# Patient Record
Sex: Male | Born: 1955 | Race: White | Hispanic: No | Marital: Married | Smoking: Former smoker
Health system: Southern US, Community
[De-identification: ages and names within clinical notes are randomized; demographics above are authoritative.]

## PROBLEM LIST (undated history)

## (undated) DIAGNOSIS — J939 Pneumothorax, unspecified: Secondary | ICD-10-CM

## (undated) DIAGNOSIS — I10 Essential (primary) hypertension: Secondary | ICD-10-CM

## (undated) DIAGNOSIS — N189 Chronic kidney disease, unspecified: Secondary | ICD-10-CM

## (undated) DIAGNOSIS — E785 Hyperlipidemia, unspecified: Secondary | ICD-10-CM

## (undated) DIAGNOSIS — K219 Gastro-esophageal reflux disease without esophagitis: Secondary | ICD-10-CM

## (undated) HISTORY — DX: Gastro-esophageal reflux disease without esophagitis: K21.9

## (undated) HISTORY — PX: COSMETIC SURGERY: SHX468

## (undated) HISTORY — DX: Pneumothorax, unspecified: J93.9

## (undated) HISTORY — PX: SPINE SURGERY: SHX786

## (undated) HISTORY — PX: NOSE SURGERY: SHX723

## (undated) HISTORY — PX: BACK SURGERY: SHX140

## (undated) HISTORY — DX: Hyperlipidemia, unspecified: E78.5

---

## 2006-11-05 ENCOUNTER — Observation Stay (HOSPITAL_COMMUNITY): Admission: EM | Admit: 2006-11-05 | Discharge: 2006-11-05 | Payer: Self-pay | Admitting: Emergency Medicine

## 2008-01-04 IMAGING — CT CT HEAD W/O CM
1 of 2 series · 13 of 30 positions shown, 17 images · IV contrast (agent unspecified)
Comparison: None.

CLINICAL DATA: Tingling in right hand and leg.  
 HEAD CT WITHOUT CONTRAST:
TECHNIQUE: Contiguous axial images were obtained from the base of the skull through the vertex according to standard protocol without contrast.

[Series 2: head routine 4.8 h47s · axial · 0.45mm/px · z∈[-73,+43]mm · 13 of 30 slices shown, 17 images]
[im 3/30  brain]
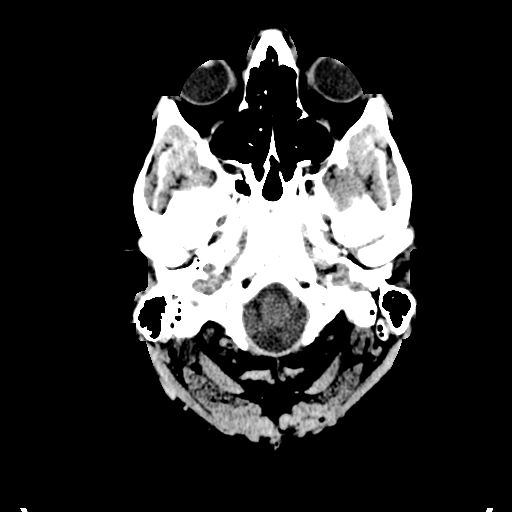
[im 3/30  bone]
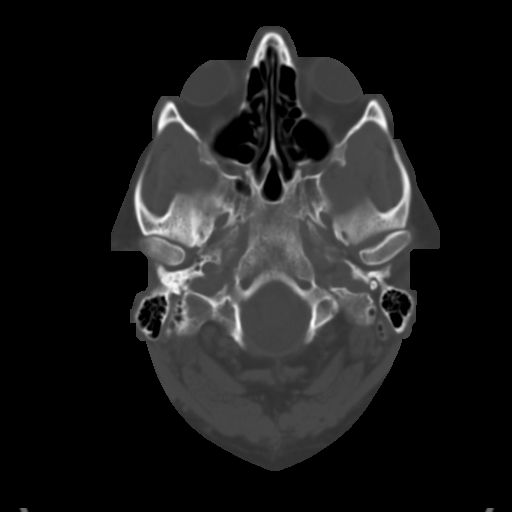
[im 5/30  brain]
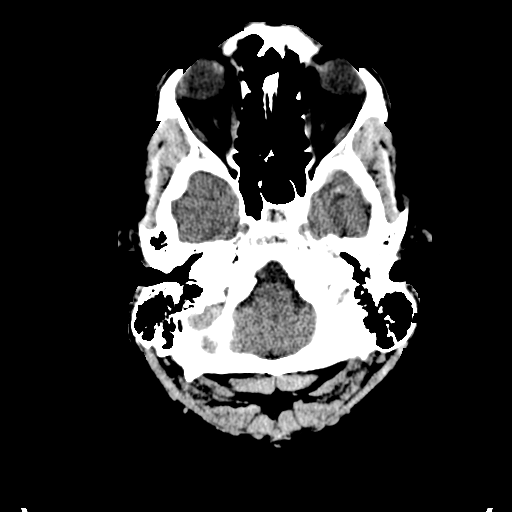
[im 7/30  brain]
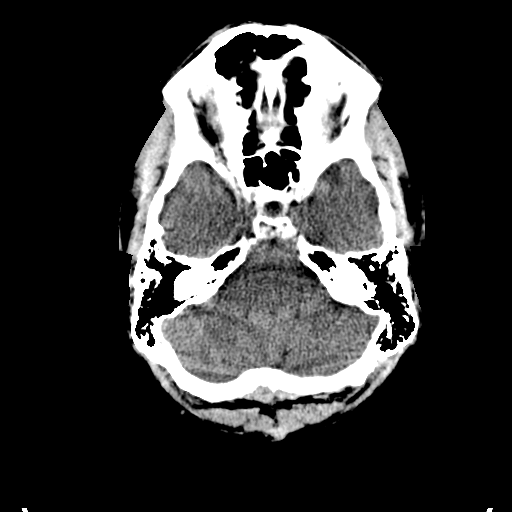
[im 9/30  brain]
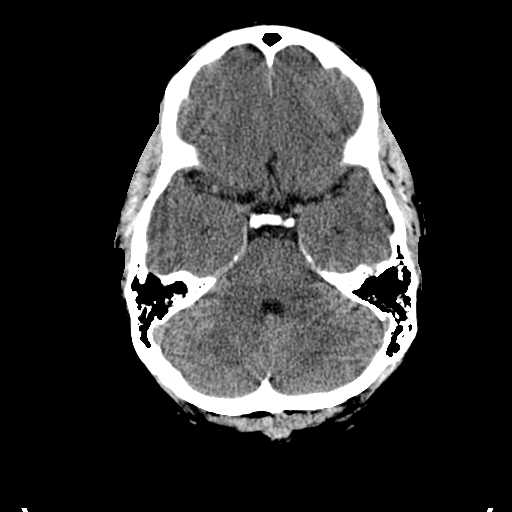
[im 11/30  brain]
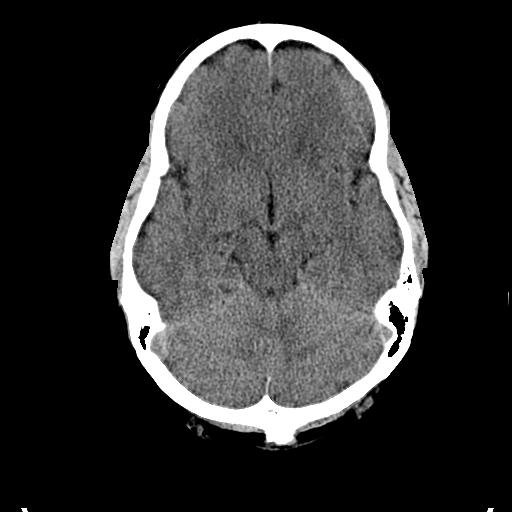
[im 11/30  bone]
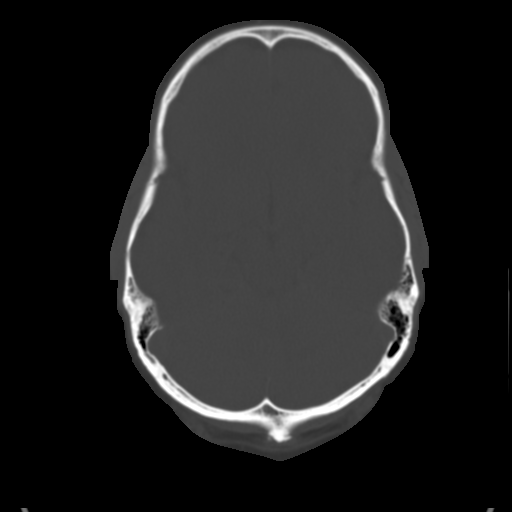
[im 13/30  brain]
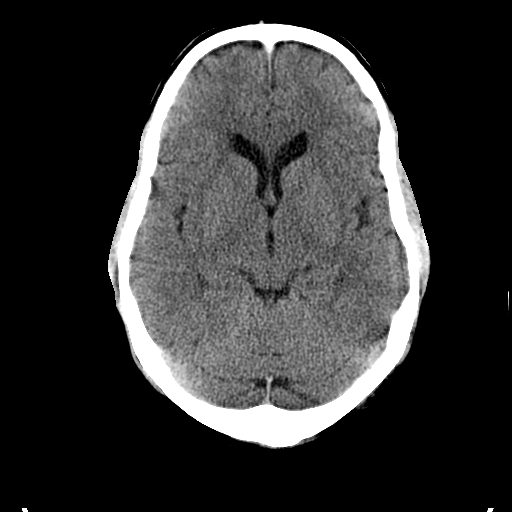
[im 15/30  brain]
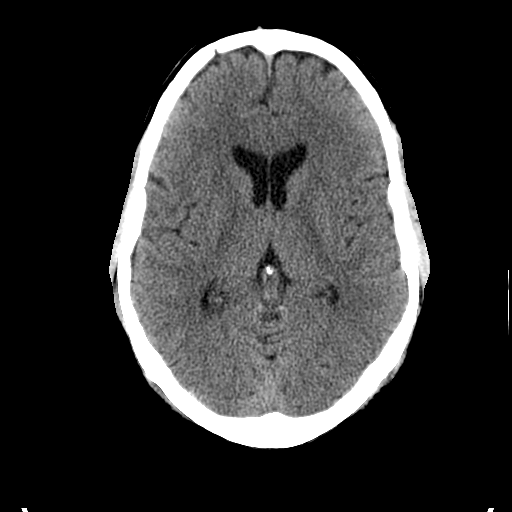
[im 17/30  brain]
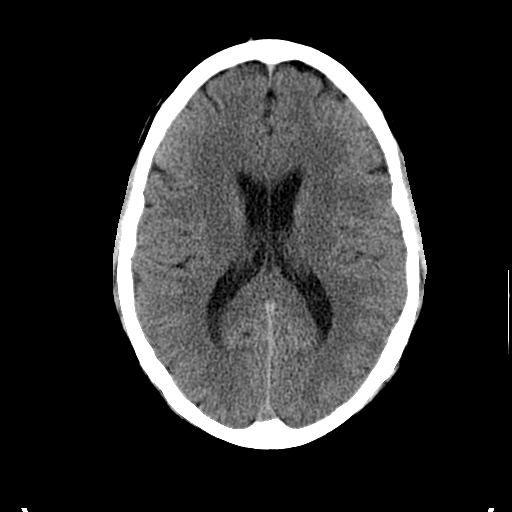
[im 19/30  brain]
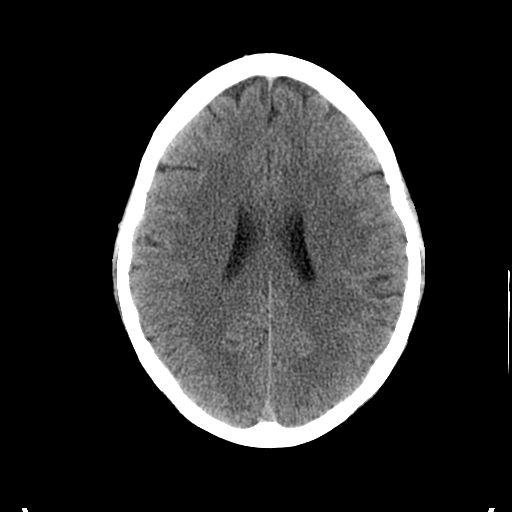
[im 19/30  bone]
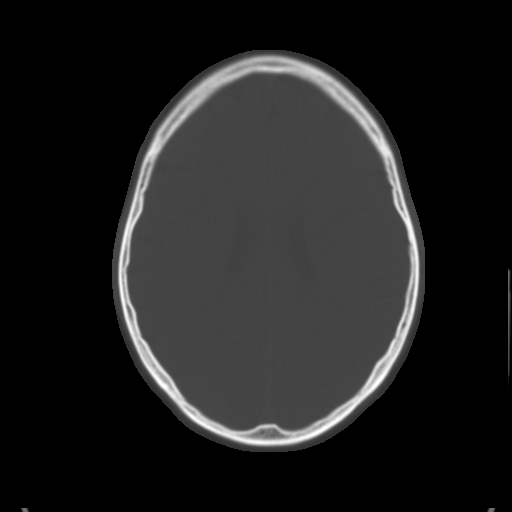
[im 21/30  brain]
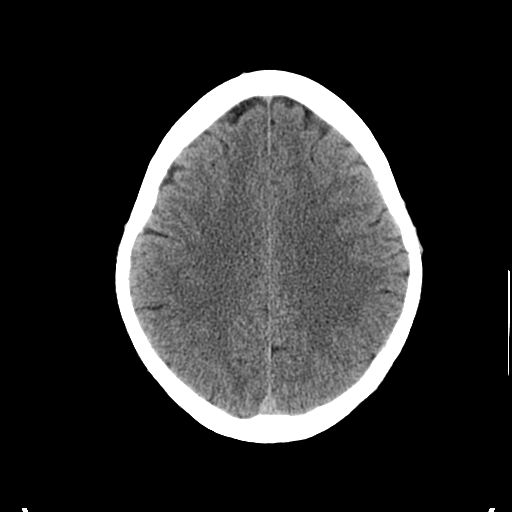
[im 23/30  brain]
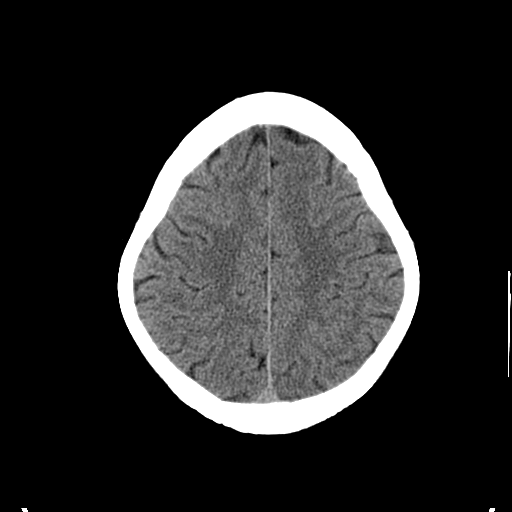
[im 25/30  brain]
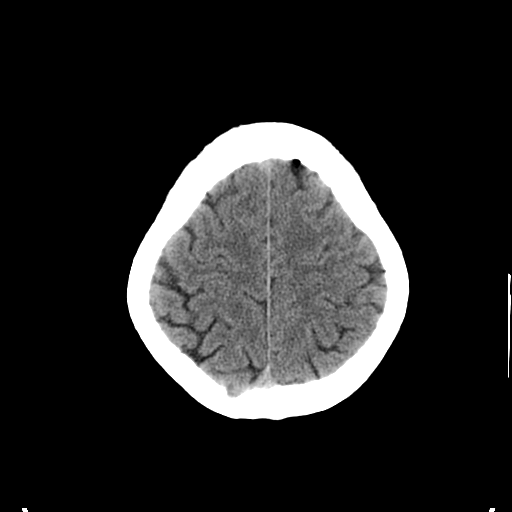
[im 27/30  brain]
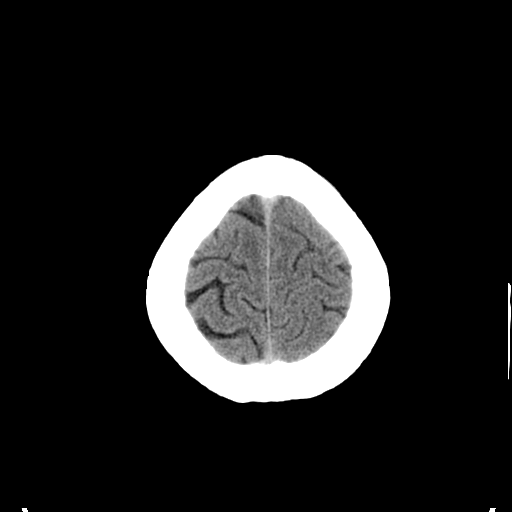
[im 27/30  bone]
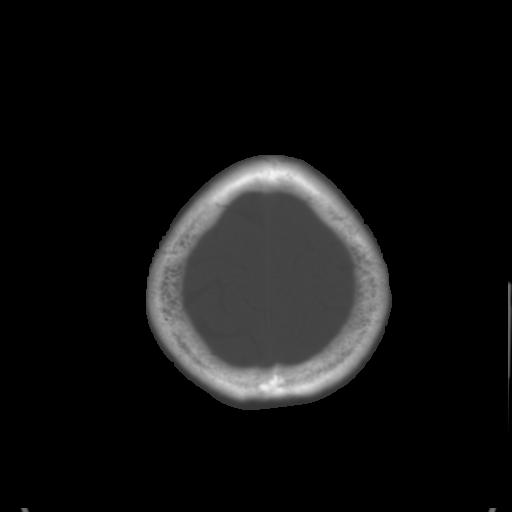

[13 of 30 positions shown; findings below may reference images not displayed]

FINDINGS: The brain appears normal without evidence of hemorrhage, infarct, mass, mass effect, midline shift or abnormal extraaxial fluid collection. No hydrocephalus.  Imaged paranasal sinuses and mastoid air cells are clear.
IMPRESSION: Negative exam.

## 2011-12-12 ENCOUNTER — Ambulatory Visit
Admission: RE | Admit: 2011-12-12 | Discharge: 2011-12-12 | Disposition: A | Discharge status: Home / Self Care | Payer: 59 | Source: Ambulatory Visit | Attending: Geriatric Medicine | Admitting: Geriatric Medicine

## 2011-12-12 ENCOUNTER — Other Ambulatory Visit: Payer: Self-pay | Admitting: Geriatric Medicine

## 2011-12-12 DIAGNOSIS — R109 Unspecified abdominal pain: Secondary | ICD-10-CM

## 2011-12-13 ENCOUNTER — Encounter (HOSPITAL_COMMUNITY): Payer: Self-pay | Admitting: Anesthesiology

## 2011-12-13 ENCOUNTER — Encounter (HOSPITAL_COMMUNITY): Payer: Self-pay | Admitting: *Deleted

## 2011-12-13 ENCOUNTER — Other Ambulatory Visit: Payer: Self-pay | Admitting: Urology

## 2011-12-13 ENCOUNTER — Encounter: Payer: Self-pay | Admitting: Urology

## 2011-12-13 ENCOUNTER — Observation Stay (HOSPITAL_COMMUNITY)
Admission: AD | Admit: 2011-12-13 | Discharge: 2011-12-14 | Disposition: A | Discharge status: Home / Self Care | Payer: 59 | Source: Ambulatory Visit | Attending: Urology | Admitting: Urology

## 2011-12-13 ENCOUNTER — Inpatient Hospital Stay (HOSPITAL_COMMUNITY): Payer: 59 | Admitting: Anesthesiology

## 2011-12-13 ENCOUNTER — Encounter (HOSPITAL_COMMUNITY)
Admission: AD | Disposition: A | Discharge status: Home / Self Care | Payer: Self-pay | Source: Ambulatory Visit | Attending: Urology

## 2011-12-13 DIAGNOSIS — R109 Unspecified abdominal pain: Secondary | ICD-10-CM | POA: Insufficient documentation

## 2011-12-13 DIAGNOSIS — Z01818 Encounter for other preprocedural examination: Secondary | ICD-10-CM | POA: Insufficient documentation

## 2011-12-13 DIAGNOSIS — I10 Essential (primary) hypertension: Secondary | ICD-10-CM | POA: Insufficient documentation

## 2011-12-13 DIAGNOSIS — N201 Calculus of ureter: Principal | ICD-10-CM | POA: Insufficient documentation

## 2011-12-13 DIAGNOSIS — E669 Obesity, unspecified: Secondary | ICD-10-CM | POA: Insufficient documentation

## 2011-12-13 HISTORY — DX: Chronic kidney disease, unspecified: N18.9

## 2011-12-13 HISTORY — PX: CYSTOSCOPY W/ URETERAL STENT PLACEMENT: SHX1429

## 2011-12-13 HISTORY — DX: Essential (primary) hypertension: I10

## 2011-12-13 LAB — CBC
Hemoglobin: 13.8 g/dL (ref 13.0–17.0)
MCV: 86.8 fL (ref 78.0–100.0)
Platelets: 199 10*3/uL (ref 150–400)
RBC: 4.48 MIL/uL (ref 4.22–5.81)
RDW: 12.4 % (ref 11.5–15.5)
WBC: 12.1 10*3/uL — ABNORMAL HIGH (ref 4.0–10.5)

## 2011-12-13 LAB — COMPREHENSIVE METABOLIC PANEL
ALT: 19 U/L (ref 0–53)
AST: 15 U/L (ref 0–37)
BUN: 26 mg/dL — ABNORMAL HIGH (ref 6–23)
CO2: 25 mEq/L (ref 19–32)
GFR calc Af Amer: 46 mL/min — ABNORMAL LOW (ref >90)
GFR calc non Af Amer: 40 mL/min — ABNORMAL LOW (ref >90)
Sodium: 134 mEq/L — ABNORMAL LOW (ref 135–145)

## 2011-12-13 LAB — SURGICAL PCR SCREEN
MRSA, PCR: NEGATIVE
Staphylococcus aureus: POSITIVE — AB

## 2011-12-13 SURGERY — CYSTOSCOPY, WITH RETROGRADE PYELOGRAM AND URETERAL STENT INSERTION
Anesthesia: General | Site: Ureter | Laterality: Left | Wound class: Clean Contaminated

## 2011-12-13 MED ORDER — LIDOCAINE HCL 2 % EX GEL
CUTANEOUS | Status: AC
  Filled 2011-12-13: qty 10

## 2011-12-13 MED ORDER — FENTANYL CITRATE 0.05 MG/ML IJ SOLN
INTRAMUSCULAR | Status: DC | PRN
  Administered 2011-12-13: 50 ug via INTRAVENOUS
  Administered 2011-12-13: 25 ug via INTRAVENOUS
  Administered 2011-12-13: 50 ug via INTRAVENOUS
  Administered 2011-12-13: 25 ug via INTRAVENOUS

## 2011-12-13 MED ORDER — FENTANYL CITRATE 0.05 MG/ML IJ SOLN: 25.0000 ug | INTRAMUSCULAR | Status: DC | PRN

## 2011-12-13 MED ORDER — ACETAMINOPHEN 10 MG/ML IV SOLN
1000.0000 mg | INTRAVENOUS | Status: AC
  Administered 2011-12-13: 1000 mg via INTRAVENOUS

## 2011-12-13 MED ORDER — PROPOFOL 10 MG/ML IV EMUL
INTRAVENOUS | Status: DC | PRN
  Administered 2011-12-13 (×2): 200 mL via INTRAVENOUS

## 2011-12-13 MED ORDER — LACTATED RINGERS IV SOLN: INTRAVENOUS | Status: DC

## 2011-12-13 MED ORDER — STERILE WATER FOR IRRIGATION IR SOLN
Status: DC | PRN
  Administered 2011-12-13: 3000 mL

## 2011-12-13 MED ORDER — IOHEXOL 300 MG/ML  SOLN
INTRAMUSCULAR | Status: AC
  Filled 2011-12-13: qty 1

## 2011-12-13 MED ORDER — CEFAZOLIN SODIUM 1-5 GM-% IV SOLN
INTRAVENOUS | Status: AC
  Filled 2011-12-13: qty 50

## 2011-12-13 MED ORDER — CEFAZOLIN SODIUM 1-5 GM-% IV SOLN
1.0000 g | INTRAVENOUS | Status: AC
  Administered 2011-12-13: 1 g via INTRAVENOUS

## 2011-12-13 MED ORDER — KETOROLAC TROMETHAMINE 30 MG/ML IJ SOLN
INTRAMUSCULAR | Status: DC | PRN
  Administered 2011-12-13: 30 mg via INTRAVENOUS

## 2011-12-13 MED ORDER — LACTATED RINGERS IV SOLN
INTRAVENOUS | Status: DC | PRN
  Administered 2011-12-13: 22:00:00 via INTRAVENOUS

## 2011-12-13 MED ORDER — ACETAMINOPHEN 10 MG/ML IV SOLN
INTRAVENOUS | Status: AC
  Filled 2011-12-13: qty 100

## 2011-12-13 MED ORDER — PROMETHAZINE HCL 25 MG/ML IJ SOLN: 6.2500 mg | INTRAMUSCULAR | Status: DC | PRN

## 2011-12-13 MED ORDER — FENTANYL CITRATE 0.05 MG/ML IJ SOLN
INTRAMUSCULAR | Status: AC
  Filled 2011-12-13: qty 2

## 2011-12-13 MED ORDER — BELLADONNA ALKALOIDS-OPIUM 16.2-60 MG RE SUPP
RECTAL | Status: AC
  Filled 2011-12-13: qty 1

## 2011-12-13 MED ORDER — BELLADONNA ALKALOIDS-OPIUM 16.2-60 MG RE SUPP
RECTAL | Status: DC | PRN
  Administered 2011-12-13: 1 via RECTAL

## 2011-12-13 MED ORDER — IOHEXOL 300 MG/ML  SOLN
INTRAMUSCULAR | Status: DC | PRN
  Administered 2011-12-13: 5 mL

## 2011-12-13 MED ORDER — MIDAZOLAM HCL 5 MG/5ML IJ SOLN
INTRAMUSCULAR | Status: DC | PRN
  Administered 2011-12-13: 2 mg via INTRAVENOUS

## 2011-12-13 SURGICAL SUPPLY — 19 items
ADAPTER CATH URET PLST 4-6FR (CATHETERS) ×2 IMPLANT
ADPR CATH URET STRL DISP 4-6FR (CATHETERS) ×1
BAG URO CATCHER STRL LF (DRAPE) ×2 IMPLANT
CATH INTERMIT  6FR 70CM (CATHETERS) ×2 IMPLANT
CLOTH BEACON ORANGE TIMEOUT ST (SAFETY) ×2 IMPLANT
DRAPE CAMERA CLOSED 9X96 (DRAPES) ×2 IMPLANT
GLOVE BIOGEL M STRL SZ7.5 (GLOVE) ×2 IMPLANT
GLOVE SURG SS PI 8.5 STRL IVOR (GLOVE) ×1
GLOVE SURG SS PI 8.5 STRL STRW (GLOVE) ×1 IMPLANT
GOWN STRL NON-REIN LRG LVL3 (GOWN DISPOSABLE) ×2 IMPLANT
GOWN STRL REIN XL XLG (GOWN DISPOSABLE) ×2 IMPLANT
GUIDEWIRE STR DUAL SENSOR (WIRE) ×2 IMPLANT
MANIFOLD NEPTUNE II (INSTRUMENTS) ×2 IMPLANT
NS IRRIG 1000ML POUR BTL (IV SOLUTION) ×2 IMPLANT
PACK CYSTO (CUSTOM PROCEDURE TRAY) ×2 IMPLANT
SCRUB PCMX 4 OZ (MISCELLANEOUS) ×2 IMPLANT
STENT CONTOUR URETERAL (STENTS) ×2 IMPLANT
TUBING CONNECTING 10 (TUBING) ×2 IMPLANT
WATER STERILE IRR 3000ML UROMA (IV SOLUTION) ×2 IMPLANT

## 2011-12-13 NOTE — Anesthesia Preprocedure Evaluation (Signed)
Anesthesia Evaluation    Airway Mallampati: II TM Distance: >3 FB Neck ROM: Full    Dental No notable dental hx.    Pulmonary  clear to auscultation  Pulmonary exam normal       Cardiovascular hypertension, Pt. on medications Regular Normal    Neuro/Psych    GI/Hepatic   Endo/Other    Renal/GU      Musculoskeletal   Abdominal (+) obese,   Peds  Hematology   Anesthesia Other Findings   Reproductive/Obstetrics                           Anesthesia Physical Anesthesia Plan  ASA: II and Emergent  Anesthesia Plan: General   Post-op Pain Management:    Induction: Intravenous  Airway Management Planned: LMA  Additional Equipment:   Intra-op Plan:   Post-operative Plan: Extubation in OR  Informed Consent: I have reviewed the patients History and Physical, chart, labs and discussed the procedure including the risks, benefits and alternatives for the proposed anesthesia with the patient or authorized representative who has indicated his/her understanding and acceptance.   Dental advisory given  Plan Discussed with: CRNA  Anesthesia Plan Comments:         Anesthesia Quick Evaluation

## 2011-12-13 NOTE — H&P (Signed)
Chief Complaint  cc: Dr. Pete Glatter   Reason For Visit  Kidney stone   Active Problems Problems  1. Proximal Ureteral Stone On The Left 592.1  History of Present Illness    56 yo married male presents today as a [place] referred by Dr. Pete Glatter for a 7mm Lt proximal ureteral stone with mild hydronephrosis.  He has been having Lt flank pain x 6 days.  He has had no previous hx of kidney stones.  His daughter has had kidney stones. + constipation, nausea, and is recovering from flu.   Received a call from Dr. Laverle Hobby nurse with a verbal lab result for his creatinine of 1.64.  10/05/10  PSA - 1.04   Past Medical History Problems  1. History of  Cervical Disc Degeneration 722.4 2. History of  Hypertension 401.9  Surgical History Problems  1. History of  Neck Surgery 2. History of  Spine Repair  Current Meds 1. Hydrocodone-Acetaminophen 5-500 MG Oral Tablet; Therapy: 14Jan2013 to 2. Lisinopril-Hydrochlorothiazide 10-12.5 MG Oral Tablet; Therapy: 20Aug2012 to 3. Motrin TABS; Therapy: (Recorded:15Jan2013) to 4. Vitamin B12 TABS; Therapy: (Recorded:15Jan2013) to  Allergies Medication  1. No Known Drug Allergies  Family History Problems  1. Family history of  Family Health Status Children ___ Living Daughters 2 2. Family history of  Family Health Status Children ___ Living Sons 1 3. Family history of  Family Health Status Of Father - Alive 4. Family history of  Family Health Status Of Mother - Alive 5. Paternal history of  Heart Disease V17.49 6. Daughter's history of  Nephrolithiasis 7. Fraternal history of  Thyroid Disorder V18.19  Social History Problems    Caffeine Use 4   Former Smoker V15.82 smoked 1/2 ppd for 10 years; quit approx. 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  Recent flu, but now resolving.    Vitals Vital Signs [Data Includes: Last 1 Day]  15Jan2013 12:09PM  BMI Calculated: 33.18 BSA Calculated: 2.26 Height: 5 ft 11 in Weight: 237 lb  Blood Pressure: 160 / 103 Temperature: 98 F Heart Rate: 93  Physical Exam Constitutional: Well nourished and well developed . No acute distress.  ENT:. The ears and nose are normal in appearance.  Neck: The appearance of the neck is normal and no neck mass is present.  Pulmonary: No respiratory distress and normal respiratory rhythm and effort.  Cardiovascular: Heart rate and rhythm are normal . No peripheral edema.  Abdomen: The abdomen is soft and nontender. No masses are palpated. No CVA tenderness. No hernias are palpable. No hepatosplenomegaly noted.  Rectal: Rectal exam demonstrates normal sphincter tone, no tenderness and no masses. The prostate has no nodularity and is not tender. The left seminal vesicle is nonpalpable. The right seminal vesicle is nonpalpable. The perineum is normal on inspection.  Genitourinary: Examination of the penis demonstrates no discharge, no masses, no lesions and a normal meatus. The scrotum is without lesions. The right epididymis is palpably normal and non-tender. The left epididymis is palpably normal and non-tender. The right testis is non-tender and without masses. The left testis is non-tender and without masses.  Lymphatics: The femoral and inguinal nodes are not enlarged or tender.  Skin: Normal skin turgor, no visible rash  and no visible skin lesions.  Neuro/Psych:. Mood and affect are appropriate.    Results/Data Urine [Data Includes: Last 1 Day]   15Jan2013  COLOR YELLOW   APPEARANCE CLEAR   SPECIFIC GRAVITY 1.025   pH 5.0   GLUCOSE NEG mg/dL  BILIRUBIN NEG   KETONE NEG mg/dL  BLOOD LARGE    PROTEIN NEG mg/dL  UROBILINOGEN 0.2 mg/dL  NITRITE NEG   LEUKOCYTE ESTERASE NEG   SQUAMOUS EPITHELIAL/HPF RARE   WBC 0-3 WBC/hpf  RBC 0-3 RBC/hpf  BACTERIA RARE   CRYSTALS NONE SEEN   CASTS NONE SEEN   Other SLT MUCUS    Assessment Assessed  1. Proximal Ureteral Stone On The Left 592.1      CT shows  L upper ureteral stone, mild hydro, in pt with dehydration, and elevated psa. he is having some L flank pain, on 3 motrin q 3-5 hrs. Note acid urine (pH 5.0), and cannot see stone on KUB from CT.   Plan  1. Toradol, tylenol, and IV hydration, and stent placement tonight.  2. Cr/gfr, and KUB today pre-op.   Signatures Electronically signed by : [name], M.D.; Dec 13 2011  2:17PM

## 2011-12-13 NOTE — Progress Notes (Signed)
Patient states he will be having lithotripsy next week. Blue folder from [place] stone given to family. Brief instructions to hold aspirin and NSAIDS given to family. They verbalize understanding.

## 2011-12-13 NOTE — Progress Notes (Signed)
Patient and wife informed that surgery will be delayed until around 9 PM. They verbalize understanding.

## 2011-12-13 NOTE — Transfer of Care (Addendum)
Immediate Anesthesia Transfer of Care Note  Patient: Travis Burgess  Procedure(s) Performed:  CYSTOSCOPY WITH RETROGRADE PYELOGRAM/URETERAL STENT PLACEMENT  Patient Location: PACU  Anesthesia Type: General  Level of Consciousness: sedated, patient cooperative and responds to stimulaton  Airway & Oxygen Therapy: Patient Spontanous Breathing and Patient connected to face mask oxgen  Post-op Assessment: Report given to PACU RN and Post -op Vital signs reviewed and stable  Post vital signs: Reviewed and stable  Complications: No apparent anesthesia complications  Post vital signs: stable Filed Vitals:   12/13/11 1701  BP: 163/91  Pulse: 92  Temp: 37.1 C  Resp: 18     Temporary cap in place on release to PACU staff.

## 2011-12-13 NOTE — Anesthesia Postprocedure Evaluation (Signed)
  Anesthesia Post-op Note  Patient: Travis Burgess  Procedure(s) Performed:  CYSTOSCOPY WITH RETROGRADE PYELOGRAM/URETERAL STENT PLACEMENT  Patient Location: PACU  Anesthesia Type: General  Level of Consciousness: awake and alert   Airway and Oxygen Therapy: Patient Spontanous Breathing  Post-op Pain: mild  Post-op Assessment: Post-op Vital signs reviewed, Patient's Cardiovascular Status Stable, Respiratory Function Stable, Patent Airway and No signs of Nausea or vomiting  Post-op Vital Signs: stable  Complications: No apparent anesthesia complications

## 2011-12-13 NOTE — Op Note (Signed)
Pre-operative diagnosis : Left upper ureteral stone  Postoperative diagnosis: Same  Operation: Cystoscopy ureteroscopy, left retrograde PolyGram interpretation, left double-J stent (6 [place] by 26 cm)  Surgeon:  Kathie Rhodes. Patsi Sears, MD  Anesthesia:   General LMA  Preparation: After appropriate preanesthesia, the patient was brought to the operating room, and placed on the operating table in the dorsal supine position where general LMA anesthesia was introduced. Timeout was observed.  Review history: 56 year old male with left flank pain, nausea, and microscopic hematuria, with CT showing a 7 mm left UPJ stone, or possibly in the upper ureter. The stone is poorly visualized on KUB the disease was seen on CT. He is now for cystoscopy, left retropyelogram, and left double-J stent.  Statement of  Likelihood of Success: Excellent. TIME-OUT observed.:  Procedure: Cystourethroscopy was accomplished, showing normal penile meatus, normal distal urethra, normal proximal urethra. The prostate is 2+ and benign. The bladder neck is normal. There is clear reflux in both cortices. The bladder itself showed no trabeculation, no stones tumors or diverticula  are noted. Left retropyelogram was then performed, which shows a stone in the left upper ureter at the level of the ureteral pelvic junction. A guidewire is passed and coiled into the renal pelvis, which appears to be an intrarenal pelvis. Over the wire, a 6 [place] by 26 cm double-J stent is passed without suture area fluoroscopic control shows is in good position. The patient received a B. and O. suppository at the beginning of the procedure, and 31 g of IV Toradol at the end of the procedure. He was awakened and taken to recovery room in good condition.

## 2011-12-14 ENCOUNTER — Observation Stay (HOSPITAL_COMMUNITY): Payer: 59

## 2011-12-14 LAB — BASIC METABOLIC PANEL
GFR calc Af Amer: 47 mL/min — ABNORMAL LOW (ref >90)
GFR calc non Af Amer: 40 mL/min — ABNORMAL LOW (ref >90)
Potassium: 3.9 mEq/L (ref 3.5–5.1)
Sodium: 136 mEq/L (ref 135–145)

## 2011-12-14 MED ORDER — POLYETHYLENE GLYCOL 3350 17 G PO PACK
17.0000 g | PACK | Freq: Every day | ORAL | Status: DC | PRN
  Filled 2011-12-14: qty 1

## 2011-12-14 MED ORDER — OXYBUTYNIN CHLORIDE 5 MG PO TABS
5.0000 mg | ORAL_TABLET | Freq: Three times a day (TID) | ORAL | Status: DC | PRN
  Filled 2011-12-14: qty 1

## 2011-12-14 MED ORDER — INFLUENZA VIRUS VACC SPLIT PF IM SUSP
0.5000 mL | Freq: Once | INTRAMUSCULAR | Status: AC
  Administered 2011-12-14: 0.5 mL via INTRAMUSCULAR
  Filled 2011-12-14: qty 0.5

## 2011-12-14 MED ORDER — DEXTROSE-NACL 5-0.45 % IV SOLN
INTRAVENOUS | Status: DC
  Administered 2011-12-14 (×2): via INTRAVENOUS

## 2011-12-14 MED ORDER — HYDROCODONE-ACETAMINOPHEN 10-325 MG PO TABS: 1.0000 | ORAL_TABLET | Freq: Four times a day (QID) | ORAL | Status: DC | PRN

## 2011-12-14 MED ORDER — HYDROCHLOROTHIAZIDE 12.5 MG PO CAPS
12.5000 mg | ORAL_CAPSULE | Freq: Every day | ORAL | Status: DC
  Administered 2011-12-14: 12.5 mg via ORAL
  Filled 2011-12-14: qty 1

## 2011-12-14 MED ORDER — CIPROFLOXACIN HCL 500 MG PO TABS: 500.0000 mg | ORAL_TABLET | Freq: Two times a day (BID) | ORAL | Status: AC

## 2011-12-14 MED ORDER — LISINOPRIL 10 MG PO TABS
10.0000 mg | ORAL_TABLET | Freq: Every day | ORAL | Status: DC
  Administered 2011-12-14: 10 mg via ORAL
  Filled 2011-12-14: qty 1

## 2011-12-14 MED ORDER — ONDANSETRON HCL 4 MG/2ML IJ SOLN: 4.0000 mg | INTRAMUSCULAR | Status: DC | PRN

## 2011-12-14 MED ORDER — OXYBUTYNIN CHLORIDE 5 MG PO TABS: 5.0000 mg | ORAL_TABLET | Freq: Three times a day (TID) | ORAL | Status: AC | PRN

## 2011-12-14 MED ORDER — DIPHENHYDRAMINE HCL 50 MG/ML IJ SOLN: 12.5000 mg | Freq: Four times a day (QID) | INTRAMUSCULAR | Status: DC | PRN

## 2011-12-14 MED ORDER — ZOLPIDEM TARTRATE 5 MG PO TABS
5.0000 mg | ORAL_TABLET | Freq: Every evening | ORAL | Status: DC | PRN
  Administered 2011-12-14: 5 mg via ORAL
  Filled 2011-12-14: qty 1

## 2011-12-14 MED ORDER — HYDROMORPHONE HCL PF 1 MG/ML IJ SOLN: 0.5000 mg | INTRAMUSCULAR | Status: DC | PRN

## 2011-12-14 MED ORDER — LISINOPRIL-HYDROCHLOROTHIAZIDE 10-12.5 MG PO TABS: 1.0000 | ORAL_TABLET | Freq: Every day | ORAL | Status: DC

## 2011-12-14 MED ORDER — DIPHENHYDRAMINE HCL 12.5 MG/5ML PO ELIX: 12.5000 mg | ORAL_SOLUTION | Freq: Four times a day (QID) | ORAL | Status: DC | PRN

## 2011-12-14 MED ORDER — NITROFURANTOIN MONOHYD MACRO 100 MG PO CAPS
100.0000 mg | ORAL_CAPSULE | Freq: Two times a day (BID) | ORAL | Status: DC
  Administered 2011-12-14 (×2): 100 mg via ORAL
  Filled 2011-12-14 (×3): qty 1

## 2011-12-14 MED ORDER — HYDROCODONE-ACETAMINOPHEN 5-325 MG PO TABS: 1.0000 | ORAL_TABLET | ORAL | Status: DC | PRN

## 2011-12-14 NOTE — Discharge Instructions (Signed)
Ureteral Stent A ureteral stent is a soft plastic tube with multiple holes. The stent is inserted into a ureter to help drain urine from the kidney into the bladder. The tube has a coil on each end to keep it from falling out. One end stays in the kidney. The other end stays in the bladder. A stent cannot be seen from the outside. Usually it does not keep you from going about normal routines. A ureteral stent is used to bypass a blockage in your kidney or ureter. This blockage can be caused by kidney stones, scar tissue, pregnancy, or other causes. It can also be used during treatment to remove a kidney stone or to let a ureter heal after surgery. The stent allows urine to drain from the kidney into the bladder. It is most often taken out after the blockage has been removed or the ureter has healed. If a stent is needed for a long time, it will be changed every few months. INSERTING THE STENT Your stent is put in by a urologist. This is a medical doctor trained for treating genitourinary (kidney, ureter and bladder) problems. Before your stent is put in, your caregiver may order x-rays or other imaging tests of your kidneys and ureters. The stent is inserted in a hospital or same day surgical center. You can anticipate going home the same day. PROCEDURE  A special x-ray machine called a fluoroscope is used to guide the insertion of your stent. This allows your doctor to make sure the stent is in the correct place.   First you are given anesthesia to keep you comfortable.   Then your doctor inserts a special lighted instrument called a cystoscope into your bladder. This allows your doctor to see the opening to the ureter.   A thin wire is carefully threaded into the bladder and up the ureter. The stent is inserted over the wire and the wire is then removed.  HOME CARE INSTRUCTIONS   While the stent is in place, you may feel some discomfort. Certain movements may trigger pain or a feeling that you need  to urinate. Your caregiver may give you pain medication. Only take over-the-counter or prescription medicines for pain, discomfort, or fever as directed by your caregiver. Do not take aspirin as this can make bleeding worse.   You may be given medications to prevent infection or bladder spasms. Be sure to take all medications as directed.   Drink plenty of fluids.   You may have small amounts of bleeding causing your urine to be slightly red. This is nothing to be concerned about.  REMOVAL OF THE STENT Your stent is left in until the blockage is resolved. This may take two weeks or longer. Before the stent is removed, you may have an x-ray make sure the ureter is open. The stent can be removed by your caregiver in the office. Medications may be given for comfort. Be sure to keep all follow-up appointments so your caregiver can check that you are healing properly. SEEK IMMEDIATE MEDICAL CARE IF:   Your urine is dark red or has blood clots.   You are incontinent (leaking urine).   You have an oral temperature above 102 F (38.9 C), chills, nausea (feeling sick to your stomach), or vomiting.   Your pain is not relieved by pain medication. Do not take aspirin as this can make bleeding worse.   The end of the stent comes out of the urethra.  Document Released: 11/11/2000 Document Revised:   07/27/2011 Document Reviewed: 11/10/2008 [employer] Patient Information 2012 [employer]. 

## 2011-12-14 NOTE — Progress Notes (Signed)
Patient received discharge instructions with wife at bedside. Both verbalized understanding without further questions. Patient follow up appts and medications discussed. Patient prescriptions given. Patient belongings packed and patient to ambulate downstairs to be discharged home.

## 2011-12-14 NOTE — Plan of Care (Signed)
Problem: Diagnosis - Type of Surgery Goal: General Surgical Patient Education (See Patient Education module for education specifics) Outcome: Completed/Met Date Met:  12/14/11 Lt Double-J stent placement

## 2011-12-14 NOTE — Discharge Summary (Cosign Needed)
  Patient with prior h/o nephrolithiasis admitted 12/13/11 with severe left flank pain and elevated Cr.  Patient underwent emergent cystoscopy with left RGP, ureteroscopy and DJ stent placement.  He has had an uneventful hospital course.  Cr has remained stable.  Cr at discharge is 1.83; was 1.81 on admission.  Patient's vitals are stable and he is ready for discharge home with plans for outpatinet left ESWL.

## 2011-12-15 ENCOUNTER — Other Ambulatory Visit: Payer: Self-pay | Admitting: Urology

## 2011-12-15 ENCOUNTER — Encounter (HOSPITAL_COMMUNITY): Payer: Self-pay | Admitting: Urology

## 2011-12-16 ENCOUNTER — Other Ambulatory Visit: Payer: Self-pay | Admitting: Urology

## 2011-12-16 ENCOUNTER — Encounter (HOSPITAL_COMMUNITY): Payer: Self-pay | Admitting: Pharmacy Technician

## 2011-12-16 ENCOUNTER — Encounter (HOSPITAL_COMMUNITY): Payer: Self-pay

## 2011-12-16 NOTE — Progress Notes (Signed)
Instructed to take laxatives/enemas as per blue folder  No aspirin, advil , or toradol until after litho  Take laxative as instructed in blue folder

## 2011-12-19 ENCOUNTER — Ambulatory Visit (HOSPITAL_COMMUNITY): Payer: 59

## 2011-12-19 ENCOUNTER — Encounter (HOSPITAL_COMMUNITY)
Admission: RE | Disposition: A | Discharge status: Home / Self Care | Payer: Self-pay | Source: Ambulatory Visit | Attending: Urology

## 2011-12-19 ENCOUNTER — Encounter (HOSPITAL_COMMUNITY): Payer: Self-pay | Admitting: *Deleted

## 2011-12-19 ENCOUNTER — Ambulatory Visit (HOSPITAL_COMMUNITY)
Admission: RE | Admit: 2011-12-19 | Discharge: 2011-12-19 | Disposition: A | Discharge status: Home / Self Care | Payer: 59 | Source: Ambulatory Visit | Attending: Urology | Admitting: Urology

## 2011-12-19 DIAGNOSIS — I10 Essential (primary) hypertension: Secondary | ICD-10-CM | POA: Insufficient documentation

## 2011-12-19 DIAGNOSIS — N200 Calculus of kidney: Secondary | ICD-10-CM | POA: Insufficient documentation

## 2011-12-19 SURGERY — LITHOTRIPSY, ESWL
Anesthesia: LOCAL | Laterality: Left

## 2011-12-19 MED ORDER — DIAZEPAM 5 MG PO TABS
10.0000 mg | ORAL_TABLET | ORAL | Status: AC
  Administered 2011-12-19: 10 mg via ORAL

## 2011-12-19 MED ORDER — DEXTROSE-NACL 5-0.45 % IV SOLN
INTRAVENOUS | Status: DC
  Administered 2011-12-19: 16:00:00 via INTRAVENOUS

## 2011-12-19 MED ORDER — DIPHENHYDRAMINE HCL 25 MG PO CAPS
25.0000 mg | ORAL_CAPSULE | ORAL | Status: AC
  Administered 2011-12-19: 25 mg via ORAL

## 2011-12-19 MED ORDER — CIPROFLOXACIN HCL 500 MG PO TABS
500.0000 mg | ORAL_TABLET | ORAL | Status: AC
  Administered 2011-12-19: 500 mg via ORAL

## 2011-12-19 MED FILL — Mupirocin Oint 2%: CUTANEOUS | Qty: 22 | Status: AC

## 2011-12-19 NOTE — H&P (Signed)
hief Complaint  cc: Dr. Pete Glatter   Reason For Visit  Kidney stone   Active Problems Problems  1. Proximal Ureteral Stone On The Left 592.1  History of Present Illness    56 yo married male,  referred by Dr. Pete Glatter for a 7mm Lt proximal ureteral stone with mild hydronephrosis.  He had Lt flank pain x 6 days.  He has had no previous hx of kidney stones.  His daughter has had kidney stones. + constipation, nausea, and is recovering from flu. He is now post JJ stent, with admission for hydration and f/u of Cr.  Received a call from Dr. Laverle Hobby nurse with a verbal lab result for his creatinine of 1.64.  10/05/10  PSA - 1.04   Past Medical History Problems  1. History of  Cervical Disc Degeneration 722.4 2. History of  Hypertension 401.9  Surgical History Problems  1. History of  Neck Surgery 2. History of  Spine Repair  Current Meds 1. Hydrocodone-Acetaminophen 5-500 MG Oral Tablet; Therapy: 14Jan2013 to 2. Lisinopril-Hydrochlorothiazide 10-12.5 MG Oral Tablet; Therapy: 20Aug2012 to 3. Motrin TABS; Therapy: (Recorded:15Jan2013) to 4. Vitamin B12 TABS; Therapy: (Recorded:15Jan2013) to  Allergies Medication  1. No Known Drug Allergies  Family History Problems  1. Family history of  Family Health Status Children ___ Living Daughters 2 2. Family history of  Family Health Status Children ___ Living Sons 1 3. Family history of  Family Health Status Of Father - Alive 4. Family history of  Family Health Status Of Mother - Alive 5. Paternal history of  Heart Disease V17.49 6. Daughter's history of  Nephrolithiasis 7. Fraternal history of  Thyroid Disorder V18.19  Social History Problems    Caffeine Use 4   Former Smoker V15.82 smoked 1/2 ppd for 10 years; quit approx. 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  Recent flu, but now resolving.    Vitals Vital Signs [Data Includes: Last 1 Day]  15Jan2013 12:09PM  BMI Calculated: 33.18 BSA Calculated: 2.26 Height: 5 ft 11 in Weight: 237 lb  Blood Pressure: 160 / 103 Temperature: 98 F Heart Rate: 93  Physical Exam Constitutional: Well nourished and well developed . No acute distress.  ENT:. The ears and nose are normal in appearance.  Neck: The appearance of the neck is normal and no neck mass is present.  Pulmonary: No respiratory distress and normal respiratory rhythm and effort.  Cardiovascular: Heart rate and rhythm are normal . No peripheral edema.  Abdomen: The abdomen is soft and nontender. No masses are palpated. No CVA tenderness. No hernias are palpable. No hepatosplenomegaly noted.  Rectal: Rectal exam demonstrates normal sphincter tone, no tenderness and no masses. The prostate has no nodularity and is not tender. The left seminal vesicle is nonpalpable. The right seminal vesicle is nonpalpable. The perineum is normal on inspection.  Genitourinary: Examination of the penis demonstrates no discharge, no masses, no lesions and a normal meatus. The scrotum is without lesions. The right epididymis is palpably normal and non-tender. The left epididymis is palpably normal and non-tender. The right testis is non-tender and without masses. The left testis is non-tender and without masses.  Lymphatics: The femoral and inguinal nodes are not enlarged or tender.  Skin: Normal skin turgor, no visible rash and no visible skin lesions.  Neuro/Psych:. Mood and affect are appropriate.    Results/Data Urine [Data Includes: Last 1 Day]   15Jan2013  COLOR YELLOW   APPEARANCE CLEAR   SPECIFIC GRAVITY 1.025   pH 5.0   GLUCOSE NEG mg/dL  BILIRUBIN  NEG   KETONE NEG mg/dL  BLOOD LARGE   PROTEIN NEG mg/dL  UROBILINOGEN 0.2 mg/dL  NITRITE NEG   LEUKOCYTE ESTERASE NEG   SQUAMOUS EPITHELIAL/HPF RARE   WBC 0-3 WBC/hpf  RBC 0-3 RBC/hpf  BACTERIA RARE   CRYSTALS NONE SEEN   CASTS NONE SEEN   Other SLT MUCUS    Assessment Assessed  1. Proximal Ureteral Stone On The Left 592.1      CT shows  L upper ureteral stone, mild hydro, in pt with dehydration, and elevated psa. he is having some L flank pain, on 3 motrin q 3-5 hrs. Note acid urine (pH 5.0), and cannot see stone on KUB from CT. He is post JJ stent, and stone can be seen on Lithotripsy fluoro.  Plan: Lithotripsy of 7mm stone.   Signatures Electronically signed by : [name], M.D.; Dec 13 2011  2:17PM

## 2011-12-19 NOTE — Discharge Summary (Signed)
Physician Discharge Summary  Patient ID: Travis Burgess MRN: [id number] DOB/AGE: Jul 14, 1956 56 y.o.  Admit date: 12/13/2011 Discharge date: 12/19/2011  Admission Diagnoses:  Discharge Diagnoses:    Discharged Condition: good  Hospital ureteral stone: dehydration  Consults: none  Significant Diagnostic Studies: labs:creatinine: 1.81  Treatments: cysto, JJ stent  Discharge Exam: Blood pressure 131/74, pulse 68, temperature 98.3 F (36.8 C), temperature source Oral, resp. rate 18, height 5\' 11"  (1.803 m), weight 105.2 kg (231 lb 14.8 oz), SpO2 97.00%. General appearance: alert and cooperative  Disposition: Home or Self Care   Medication List  As of 12/19/2011  5:24 PM   STOP taking these medications         HYDROcodone-acetaminophen 5-500 MG per tablet         TAKE these medications         ciprofloxacin 500 MG tablet   Commonly known as: CIPRO   Take 1 tablet (500 mg total) by mouth 2 (two) times daily.      lisinopril-hydrochlorothiazide 10-12.5 MG per tablet   Commonly known as: PRINZIDE,ZESTORETIC   Take 1 tablet by mouth daily before breakfast.      oxybutynin 5 MG tablet   Commonly known as: DITROPAN   Take 1 tablet (5 mg total) by mouth every 8 (eight) hours as needed (as needed for spasms).           Follow-up Information    Follow up with [name] I, MD. Schedule an appointment as soon as possible for a visit in 1 week. (scheduleOP Left ESWL)    Contact information:   [address] [place], [address] [facility] [place] [address] [phone]          Signed: [name] I 12/19/2011, 5:24 PM

## 2011-12-19 NOTE — Progress Notes (Signed)
States compliant with laxative  And no vitamins, herbal meds. , aspirin or ibuprofen.

## 2011-12-19 NOTE — Interval H&P Note (Signed)
History and Physical Interval Note:  12/19/2011 4:57 PM  Travis Burgess  has presented today for surgery, with the diagnosis of Left Ureteral Stone  The various methods of treatment have been discussed with the patient and family. After consideration of risks, benefits and other options for treatment, the patient has consented to  Procedure(s): EXTRACORPOREAL SHOCK WAVE LITHOTRIPSY (ESWL) as a surgical intervention .  The patients' history has been reviewed, patient examined, no change in status, stable for surgery.  I have reviewed the patients' chart and labs.  Questions were answered to the patient's satisfaction.     [name] I

## 2013-02-16 IMAGING — CR DG ABDOMEN 1V
1 series · 1 of 1 positions shown · non-contrast
Comparison: 12/14/2011

CLINICAL DATA: Pre lithotripsy for left ureteral stone.

ABDOMEN - 1 VIEW

[t abdomen supine]
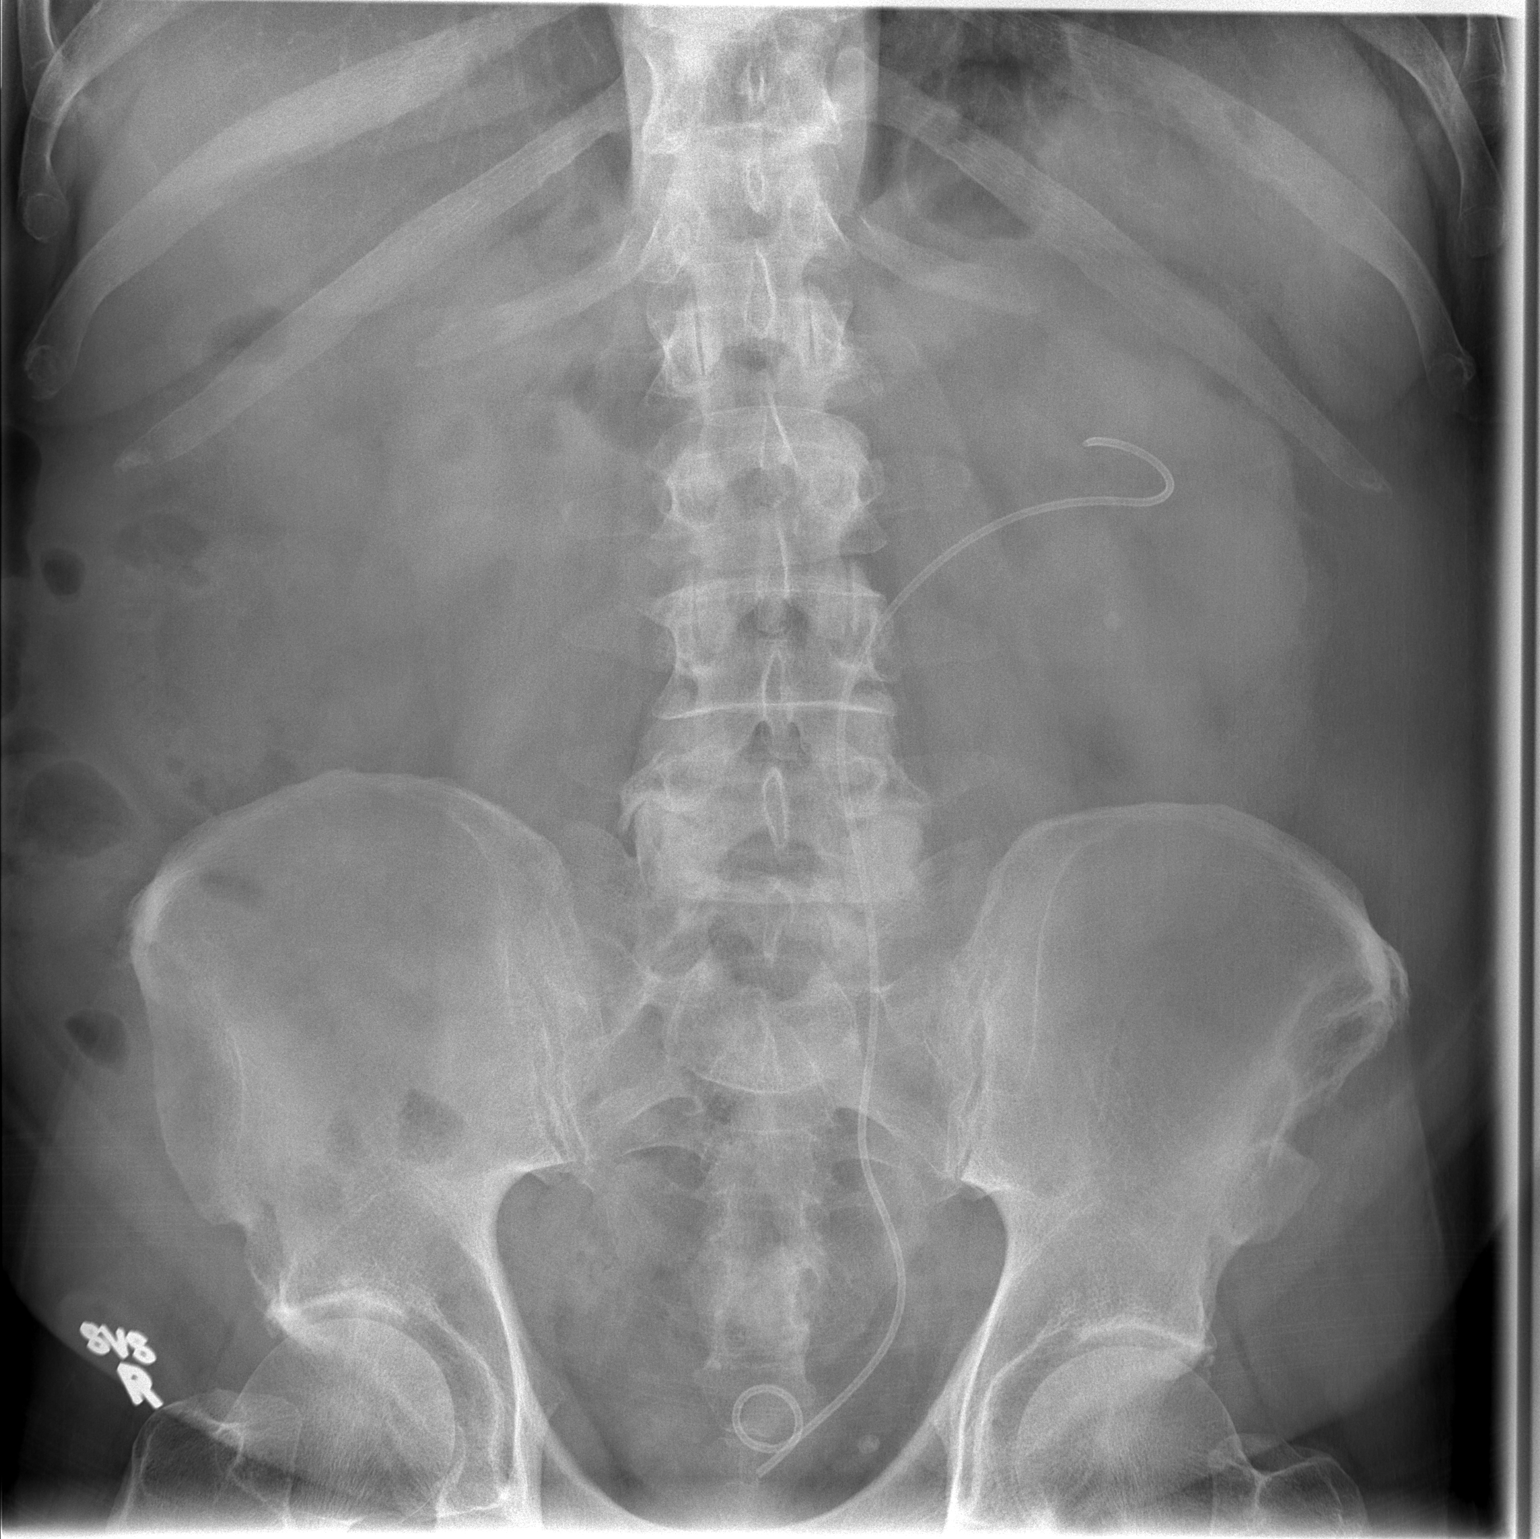

[1 of 1 positions shown; findings below may reference images not displayed]

FINDINGS: Left double-J internal ureteral stent is stable.  The 5
mm stone seen along the mid stent on the previous study is no
longer evident, but today there is a 5 mm calcification projecting
over the interpolar region of the left kidney, torso lower pole.
Retro or great migration of the stone back up into the left renal
pelvis kidney would be a consideration.  Stable appearance of the
left pelvic phlebolith.
IMPRESSION: No stone is seen along the course of the left ureteral stent on
today's study but there is a new density in the region of the lower
pole of the left kidney which could represent retrograde migration
of the stone into the lower pole collecting system.

## 2017-03-24 ENCOUNTER — Ambulatory Visit
Admission: RE | Admit: 2017-03-24 | Discharge: 2017-03-24 | Disposition: A | Discharge status: Home / Self Care | Payer: 59 | Source: Ambulatory Visit | Attending: Geriatric Medicine | Admitting: Geriatric Medicine

## 2017-03-24 ENCOUNTER — Other Ambulatory Visit: Payer: Self-pay | Admitting: Geriatric Medicine

## 2017-03-24 DIAGNOSIS — M545 Low back pain, unspecified: Secondary | ICD-10-CM

## 2017-03-24 DIAGNOSIS — R109 Unspecified abdominal pain: Secondary | ICD-10-CM | POA: Diagnosis not present

## 2017-03-24 DIAGNOSIS — I10 Essential (primary) hypertension: Secondary | ICD-10-CM | POA: Diagnosis not present

## 2017-03-24 DIAGNOSIS — Z125 Encounter for screening for malignant neoplasm of prostate: Secondary | ICD-10-CM | POA: Diagnosis not present

## 2017-03-24 DIAGNOSIS — Z79899 Other long term (current) drug therapy: Secondary | ICD-10-CM | POA: Diagnosis not present

## 2017-03-24 DIAGNOSIS — Z87442 Personal history of urinary calculi: Secondary | ICD-10-CM

## 2017-07-04 DIAGNOSIS — I10 Essential (primary) hypertension: Secondary | ICD-10-CM | POA: Diagnosis not present

## 2017-07-08 DIAGNOSIS — H40033 Anatomical narrow angle, bilateral: Secondary | ICD-10-CM | POA: Diagnosis not present

## 2017-07-08 DIAGNOSIS — H04123 Dry eye syndrome of bilateral lacrimal glands: Secondary | ICD-10-CM | POA: Diagnosis not present

## 2017-07-14 DIAGNOSIS — H578 Other specified disorders of eye and adnexa: Secondary | ICD-10-CM | POA: Diagnosis not present

## 2017-07-24 DIAGNOSIS — B303 Acute epidemic hemorrhagic conjunctivitis (enteroviral): Secondary | ICD-10-CM | POA: Diagnosis not present

## 2017-08-01 DIAGNOSIS — B303 Acute epidemic hemorrhagic conjunctivitis (enteroviral): Secondary | ICD-10-CM | POA: Diagnosis not present

## 2017-08-07 DIAGNOSIS — B303 Acute epidemic hemorrhagic conjunctivitis (enteroviral): Secondary | ICD-10-CM | POA: Diagnosis not present

## 2017-09-15 DIAGNOSIS — Z23 Encounter for immunization: Secondary | ICD-10-CM | POA: Diagnosis not present

## 2018-01-03 DIAGNOSIS — Z79899 Other long term (current) drug therapy: Secondary | ICD-10-CM | POA: Diagnosis not present

## 2018-01-03 DIAGNOSIS — R05 Cough: Secondary | ICD-10-CM | POA: Diagnosis not present

## 2018-01-03 DIAGNOSIS — I10 Essential (primary) hypertension: Secondary | ICD-10-CM | POA: Diagnosis not present

## 2018-04-03 DIAGNOSIS — Z79899 Other long term (current) drug therapy: Secondary | ICD-10-CM | POA: Diagnosis not present

## 2018-04-03 DIAGNOSIS — E7800 Pure hypercholesterolemia, unspecified: Secondary | ICD-10-CM | POA: Diagnosis not present

## 2018-04-03 DIAGNOSIS — I10 Essential (primary) hypertension: Secondary | ICD-10-CM | POA: Diagnosis not present

## 2018-08-13 DIAGNOSIS — Z79899 Other long term (current) drug therapy: Secondary | ICD-10-CM | POA: Diagnosis not present

## 2018-08-13 DIAGNOSIS — E7800 Pure hypercholesterolemia, unspecified: Secondary | ICD-10-CM | POA: Diagnosis not present

## 2018-08-13 DIAGNOSIS — I10 Essential (primary) hypertension: Secondary | ICD-10-CM | POA: Diagnosis not present

## 2018-08-13 DIAGNOSIS — Z125 Encounter for screening for malignant neoplasm of prostate: Secondary | ICD-10-CM | POA: Diagnosis not present

## 2018-08-13 DIAGNOSIS — Z23 Encounter for immunization: Secondary | ICD-10-CM | POA: Diagnosis not present

## 2018-08-13 DIAGNOSIS — Z0000 Encounter for general adult medical examination without abnormal findings: Secondary | ICD-10-CM | POA: Diagnosis not present

## 2018-08-15 ENCOUNTER — Other Ambulatory Visit: Payer: Self-pay | Admitting: Geriatric Medicine

## 2018-08-15 DIAGNOSIS — E7800 Pure hypercholesterolemia, unspecified: Secondary | ICD-10-CM

## 2018-08-22 ENCOUNTER — Ambulatory Visit
Admission: RE | Admit: 2018-08-22 | Discharge: 2018-08-22 | Disposition: A | Discharge status: Home / Self Care | Payer: No Typology Code available for payment source | Source: Ambulatory Visit | Attending: Geriatric Medicine | Admitting: Geriatric Medicine

## 2018-08-22 DIAGNOSIS — E7800 Pure hypercholesterolemia, unspecified: Secondary | ICD-10-CM

## 2018-11-15 DIAGNOSIS — E7800 Pure hypercholesterolemia, unspecified: Secondary | ICD-10-CM | POA: Diagnosis not present

## 2018-11-15 DIAGNOSIS — Z79899 Other long term (current) drug therapy: Secondary | ICD-10-CM | POA: Diagnosis not present

## 2019-02-12 DIAGNOSIS — Z6835 Body mass index (BMI) 35.0-35.9, adult: Secondary | ICD-10-CM | POA: Diagnosis not present

## 2019-02-12 DIAGNOSIS — Z79899 Other long term (current) drug therapy: Secondary | ICD-10-CM | POA: Diagnosis not present

## 2019-02-12 DIAGNOSIS — I10 Essential (primary) hypertension: Secondary | ICD-10-CM | POA: Diagnosis not present

## 2019-11-25 ENCOUNTER — Other Ambulatory Visit: Payer: Self-pay | Admitting: Physician Assistant

## 2019-11-25 ENCOUNTER — Ambulatory Visit: Payer: 59 | Admitting: Orthopaedic Surgery

## 2019-11-25 ENCOUNTER — Other Ambulatory Visit: Payer: Self-pay

## 2019-11-25 ENCOUNTER — Encounter: Payer: Self-pay | Admitting: Orthopaedic Surgery

## 2019-11-25 DIAGNOSIS — L03114 Cellulitis of left upper limb: Secondary | ICD-10-CM | POA: Diagnosis not present

## 2019-11-25 MED ORDER — DOXYCYCLINE HYCLATE 100 MG PO TABS: 100.0000 mg | ORAL_TABLET | Freq: Two times a day (BID) | ORAL | 1 refills | Status: DC

## 2019-11-25 MED ORDER — AMOXICILLIN 500 MG PO CAPS: 1000.0000 mg | ORAL_CAPSULE | Freq: Two times a day (BID) | ORAL | 1 refills | Status: DC

## 2019-11-25 NOTE — Progress Notes (Signed)
Office Visit Note   Patient: Travis Burgess           Date of Birth: 1956/05/01           MRN: [id number] Visit Date: 11/25/2019              Requested by: Lajean Manes, [place] [employer] [address] [place],  [place] [address] PCP: Lajean Manes, MD   Assessment & Plan: Visit Diagnoses:  1. Cellulitis of left arm     Plan: I am certainly concerned about the cellulitis of his left arm after a dog bite such as this.  I will start him on amoxicillin and doxycycline but he understands that this could worsen over the next 24 hours and would require hospitalization with IV antibiotics and surgery.  Without being said, I am on-call today and if worsens later today he will call.  If not I would like to see him first thing tomorrow morning in the office to reassess his arm.  All question concerns were answered addressed.  Follow-Up Instructions: Return in about 1 day (around 11/26/2019).   Orders:  No orders of the defined types were placed in this encounter.  Meds ordered this encounter  Medications  . amoxicillin (AMOXIL) 500 MG capsule    Sig: Take 2 capsules (1,000 mg total) by mouth 2 (two) times daily.    Dispense:  20 capsule    Refill:  1  . doxycycline (VIBRA-TABS) 100 MG tablet    Sig: Take 1 tablet (100 mg total) by mouth 2 (two) times daily.    Dispense:  20 tablet    Refill:  1      Procedures: No procedures performed   Clinical Data: No additional findings.   Subjective: Chief Complaint  Patient presents with  . Left Arm - Injury  The patient is worked in this morning with swelling and redness of his left arm.  He was bitten by his rescue dog this past Thursday.  He noticed Saturday that started swelling getting red.  He does report some forearm pain.  He denies any numbness and tingling in his hand.  He has not been on any antibiotics.  He denies any fever, chills, nausea, vomiting he does report arm pain.  HPI  Review of Systems He currently denies any  headache, chest pain, shortness of breath  Objective: Vital Signs: There were no vitals taken for this visit.  Physical Exam He is alert and orient x3 and in no acute distress Ortho Exam Examination of his left upper extremity shows on the dorsal side some scratches and obvious bite marks.  They are tender.  On the ulnar and volar aspect there is red area consistent with cellulitis.  There is some moderate pain when I palpate along this area.  There is no drainage and no crepitation of the tissues. Specialty Comments:  No specialty comments available.  Imaging: No results found.   PMFS History: There are no problems to display for this patient.  Past Medical History:  Diagnosis Date  . Chronic kidney disease    kidney stones  . Hypertension     No family history on file.  Past Surgical History:  Procedure Laterality Date  . BACK SURGERY  25 years ago   herniated disc  . BACK SURGERY    . CYSTOSCOPY W/ URETERAL STENT PLACEMENT  12/13/2011   Procedure: CYSTOSCOPY WITH RETROGRADE PYELOGRAM/URETERAL STENT PLACEMENT;  Surgeon: Ailene Rud, MD;  Location: [facility];  Service: Urology;  Laterality: Left;  . NOSE SURGERY  35 years ago   broken nose playing soccer   Social History   Occupational History  . Not on file  Tobacco Use  . Smoking status: Former Smoker    Quit date: 12/12/1980    Years since quitting: 38.9  Substance and Sexual Activity  . Alcohol use: Yes    Comment: monthly  . Drug use: No  . Sexual activity: Yes

## 2019-11-26 ENCOUNTER — Ambulatory Visit (INDEPENDENT_AMBULATORY_CARE_PROVIDER_SITE_OTHER): Payer: 59 | Admitting: Orthopaedic Surgery

## 2019-11-26 ENCOUNTER — Encounter: Payer: Self-pay | Admitting: Orthopaedic Surgery

## 2019-11-26 DIAGNOSIS — L03114 Cellulitis of left upper limb: Secondary | ICD-10-CM | POA: Diagnosis not present

## 2019-11-26 MED ORDER — AMOXICILLIN 500 MG PO CAPS: 1000.0000 mg | ORAL_CAPSULE | Freq: Two times a day (BID) | ORAL | 1 refills | Status: DC

## 2019-11-26 NOTE — Progress Notes (Signed)
Travis Burgess is following up 1 day after I saw him with left arm cellulitis of the forearm after dog bite.  I put him on amoxicillin and doxycycline.  I will to see him back today since had a good baseline with a cellulitis look like.  He says he feels actually better and his pain is minimal.  On examination of his left forearm the redness her pinky was almost completely gone.  The forearm is soft.  There is no pain with range of motion the elbow or wrist and no pain to palpation along the forearm on the dorsal or volar side.  I do feel that this is going to do well with just oral antibiotics based on how he looks to just 1 days worth of antibiotics in the system.  He will continue the antibiotics until they are done.  If things worsen at all he will let us know.  Otherwise, follow-up is as needed.

## 2021-04-15 LAB — HEPATIC FUNCTION PANEL
ALT: 23 U/L (ref 10–40)
Alkaline Phosphatase: 77 (ref 25–125)

## 2021-04-15 LAB — BASIC METABOLIC PANEL
CO2: 24 — AB (ref 13–22)
Chloride: 99 (ref 99–108)
Glucose: 86
Potassium: 4.7 mEq/L (ref 3.5–5.1)

## 2021-04-15 LAB — COMPREHENSIVE METABOLIC PANEL: Calcium: 10 (ref 8.7–10.7)

## 2021-04-15 LAB — LIPID PANEL
Cholesterol: 131 (ref 0–200)
LDL Cholesterol: 70

## 2021-04-15 LAB — CBC AND DIFFERENTIAL: WBC: 8.3

## 2021-04-15 LAB — MICROALBUMIN / CREATININE URINE RATIO: Microalb Creat Ratio: 8.5

## 2021-05-24 ENCOUNTER — Other Ambulatory Visit: Payer: Self-pay

## 2021-05-24 ENCOUNTER — Emergency Department (HOSPITAL_BASED_OUTPATIENT_CLINIC_OR_DEPARTMENT_OTHER)
Admission: EM | Admit: 2021-05-24 | Discharge: 2021-05-25 | Disposition: A | Discharge status: Home / Self Care | Payer: 59 | Attending: Emergency Medicine | Admitting: Emergency Medicine

## 2021-05-24 ENCOUNTER — Encounter (HOSPITAL_BASED_OUTPATIENT_CLINIC_OR_DEPARTMENT_OTHER): Payer: Self-pay

## 2021-05-24 DIAGNOSIS — Z23 Encounter for immunization: Secondary | ICD-10-CM | POA: Insufficient documentation

## 2021-05-24 DIAGNOSIS — Z87891 Personal history of nicotine dependence: Secondary | ICD-10-CM | POA: Diagnosis not present

## 2021-05-24 DIAGNOSIS — S70311A Abrasion, right thigh, initial encounter: Secondary | ICD-10-CM | POA: Insufficient documentation

## 2021-05-24 DIAGNOSIS — N189 Chronic kidney disease, unspecified: Secondary | ICD-10-CM | POA: Diagnosis not present

## 2021-05-24 DIAGNOSIS — S80211A Abrasion, right knee, initial encounter: Secondary | ICD-10-CM | POA: Insufficient documentation

## 2021-05-24 DIAGNOSIS — Z79899 Other long term (current) drug therapy: Secondary | ICD-10-CM | POA: Diagnosis not present

## 2021-05-24 DIAGNOSIS — X509XXA Other and unspecified overexertion or strenuous movements or postures, initial encounter: Secondary | ICD-10-CM | POA: Diagnosis not present

## 2021-05-24 DIAGNOSIS — S80811A Abrasion, right lower leg, initial encounter: Secondary | ICD-10-CM

## 2021-05-24 DIAGNOSIS — Y93K1 Activity, walking an animal: Secondary | ICD-10-CM | POA: Diagnosis not present

## 2021-05-24 DIAGNOSIS — I129 Hypertensive chronic kidney disease with stage 1 through stage 4 chronic kidney disease, or unspecified chronic kidney disease: Secondary | ICD-10-CM | POA: Insufficient documentation

## 2021-05-24 NOTE — ED Triage Notes (Signed)
Pt c/o abrasion to right thigh - pt states while walking his dogs 4 days ago, his dog's leash  got tangled over his right leg and sustained abrasion

## 2021-05-25 MED ORDER — CEPHALEXIN 250 MG PO CAPS
500.0000 mg | ORAL_CAPSULE | Freq: Once | ORAL | Status: AC
  Administered 2021-05-25: 500 mg via ORAL
  Filled 2021-05-25: qty 2

## 2021-05-25 MED ORDER — TETANUS-DIPHTH-ACELL PERTUSSIS 5-2.5-18.5 LF-MCG/0.5 IM SUSY
0.5000 mL | PREFILLED_SYRINGE | Freq: Once | INTRAMUSCULAR | Status: AC
  Administered 2021-05-25: 0.5 mL via INTRAMUSCULAR
  Filled 2021-05-25: qty 0.5

## 2021-05-25 MED ORDER — DOXYCYCLINE HYCLATE 100 MG PO CAPS: 100.0000 mg | ORAL_CAPSULE | Freq: Two times a day (BID) | ORAL | 0 refills | Status: DC

## 2021-05-25 MED ORDER — DOXYCYCLINE HYCLATE 100 MG PO TABS
100.0000 mg | ORAL_TABLET | Freq: Once | ORAL | Status: AC
  Administered 2021-05-25: 100 mg via ORAL
  Filled 2021-05-25: qty 1

## 2021-05-25 MED ORDER — CEPHALEXIN 500 MG PO CAPS: 500.0000 mg | ORAL_CAPSULE | Freq: Three times a day (TID) | ORAL | 0 refills | Status: DC

## 2021-05-25 MED ORDER — BACITRACIN ZINC 500 UNIT/GM EX OINT: 1.0000 "application " | TOPICAL_OINTMENT | Freq: Two times a day (BID) | CUTANEOUS | 0 refills | Status: DC

## 2021-05-25 NOTE — Discharge Instructions (Addendum)
Keep the wound clean and dry and apply the topical ointment 2-3 times daily.  Take antibiotics as prescribed and follow-up with your doctor.  Return to the ED with worsening pain, spreading redness, fever, difficulty moving your knee or any other concerns.

## 2021-05-25 NOTE — ED Provider Notes (Signed)
[facility] EMERGENCY DEPT Provider Note   CSN: [id number] Arrival date & time: 05/24/21  2219     History Chief Complaint  Patient presents with   Leg Injury    Travis Burgess is a 65 y.o. male.  Patient sustained abrasion from a dog leash to his right thigh and upper leg about 4 to 5 days ago while his dog got tangled underneath his leg.  He has been using topical over-the-counter cream without relief.  He comes in today because he is concerned that it is getting infected.  Has had increasing pain and redness.  He denies any bleeding or drainage.  He is having some pain moving his knee.  There has been no fevers, chills, nausea or vomiting.  He is recently getting over an upper respiratory infection and completed a course of 6 days of antibiotics. Unknown last tetanus  The history is provided by the patient.      Past Medical History:  Diagnosis Date   Chronic kidney disease    kidney stones   Hypertension     There are no problems to display for this patient.   Past Surgical History:  Procedure Laterality Date   BACK SURGERY  25 years ago   herniated disc   BACK SURGERY     CYSTOSCOPY W/ URETERAL STENT PLACEMENT  12/13/2011   Procedure: CYSTOSCOPY WITH RETROGRADE PYELOGRAM/URETERAL STENT PLACEMENT;  Surgeon: Ailene Rud, MD;  Location: [facility];  Service: Urology;  Laterality: Left;   NOSE SURGERY  35 years ago   broken nose playing soccer       History reviewed. No pertinent family history.  Social History   Tobacco Use   Smoking status: Former    Pack years: 0.00    Types: Cigarettes    Quit date: 12/12/1980    Years since quitting: 40.4   Smokeless tobacco: Never  Substance Use Topics   Alcohol use: Yes    Comment: monthly   Drug use: No    Home Medications Prior to Admission medications   Medication Sig Start Date End Date Taking? Authorizing Provider  amoxicillin (AMOXIL) 500 MG capsule Take 2 capsules (1,000 mg total) by mouth  2 (two) times daily. 11/26/19   Mcarthur Rossetti, MD  Cyanocobalamin (VITAMIN B-12) 2500 MCG SUBL Place 1 tablet under the tongue daily.    [provider]  doxycycline (VIBRA-TABS) 100 MG tablet Take 1 tablet (100 mg total) by mouth 2 (two) times daily. 11/25/19   Mcarthur Rossetti, MD  lisinopril-hydrochlorothiazide (PRINZIDE,ZESTORETIC) 10-12.5 MG per tablet Take 1 tablet by mouth daily before breakfast.     [provider]  oxymetazoline (AFRIN) 0.05 % nasal spray Place 2 sprays into the nose 2 (two) times daily as needed. Allergies    [provider]  Theanine 100 MG CAPS Take 1 capsule by mouth daily.    [provider]    Allergies    Patient has no known allergies.  Review of Systems   Review of Systems  Constitutional:  Negative for activity change, appetite change and fever.  HENT:  Negative for congestion.   Respiratory:  Negative for cough, chest tightness and shortness of breath.   Cardiovascular:  Negative for chest pain.  Gastrointestinal:  Negative for abdominal pain, nausea and vomiting.  Genitourinary:  Negative for dysuria and hematuria.  Musculoskeletal:  Positive for arthralgias and myalgias.  Skin:  Positive for wound.  Neurological:  Negative for dizziness, weakness and headaches.  all other systems are negative except as noted in the HPI and PMH.   Physical Exam Updated Vital Signs BP (!) 122/58 (BP Location: Right Arm)   Pulse 88   Temp 98.4 F (36.9 C) (Oral)   Resp 20   Ht 5\' 11"  (1.803 m)   Wt 111.1 kg   SpO2 98%   BMI 34.17 kg/m   Physical Exam Vitals and nursing note reviewed.  Constitutional:      General: He is not in acute distress.    Appearance: He is well-developed.  HENT:     Head: Normocephalic and atraumatic.     Mouth/Throat:     Pharynx: No oropharyngeal exudate.  Eyes:     Conjunctiva/sclera: Conjunctivae normal.     Pupils: Pupils are equal, round, and reactive to light.   Neck:     Comments: No meningismus. Cardiovascular:     Rate and Rhythm: Normal rate and regular rhythm.     Heart sounds: Normal heart sounds. No murmur heard. Pulmonary:     Effort: Pulmonary effort is normal. No respiratory distress.     Breath sounds: Normal breath sounds.  Abdominal:     Palpations: Abdomen is soft.     Tenderness: There is no abdominal tenderness. There is no guarding or rebound.  Musculoskeletal:        General: Tenderness and signs of injury present. Normal range of motion.     Cervical back: Normal range of motion and neck supple.     Comments: Circumferential abrasion to right thigh and posterior knee as depicted.  Mild surrounding erythema.  No fluctuance. Full range of motion of hip and knee.  Compartments are soft.   Skin:    General: Skin is warm.  Neurological:     Mental Status: He is alert and oriented to person, place, and time.     Cranial Nerves: No cranial nerve deficit.     Motor: No abnormal muscle tone.     Coordination: Coordination normal.     Comments: No ataxia on finger to nose bilaterally. No pronator drift. 5/5 strength throughout. CN 2-12 intact.Equal grip strength. Sensation intact.   Psychiatric:        Behavior: Behavior normal.       ED Results / Procedures / Treatments   Labs (all labs ordered are listed, but only abnormal results are displayed) Labs Reviewed - No data to display  EKG None  Radiology No results found.  Procedures Procedures   Medications Ordered in ED Medications  Tdap (BOOSTRIX) injection 0.5 mL (has no administration in time range)  doxycycline (VIBRA-TABS) tablet 100 mg (has no administration in time range)  cephALEXin (KEFLEX) capsule 500 mg (has no administration in time range)    ED Course  I have reviewed the triage vital signs and the nursing notes.  Pertinent labs & imaging results that were available during my care of the patient were reviewed by me and considered in my medical  decision making (see chart for details).    MDM Rules/Calculators/A&P                         Abrasion to right leg.  Neurovascular intact.  Compartments are soft.  Will update tetanus and give course of antibiotics.  Patient given topical bacitracin as well as p.o. antibiotics.  Tetanus updated.  Follow-up with PCP for recheck this week.  Return to the ED with worsening pain, spreading redness, difficulty moving knee, drainage,  fever, or other concerns. Final Clinical Impression(s) / ED Diagnoses Final diagnoses:  Abrasion of right lower extremity, initial encounter    Rx / DC Orders ED Discharge Orders     None        Kensington Rios, Annie Main, MD 05/25/21 [phone]

## 2021-08-05 ENCOUNTER — Telehealth: Payer: Self-pay

## 2021-08-05 NOTE — Telephone Encounter (Signed)
Laken' wife called asking if he can become a new patient with Dr Yong Channel. Their daughter, Tawanna Cooler is a current pt of Dr Yong Channel. Can we schedule appt? Please Advise.

## 2021-08-05 NOTE — Telephone Encounter (Signed)
Yes, make sure to reference pt wife name when making appointment.

## 2021-09-18 ENCOUNTER — Emergency Department (HOSPITAL_BASED_OUTPATIENT_CLINIC_OR_DEPARTMENT_OTHER)
Admission: EM | Admit: 2021-09-18 | Discharge: 2021-09-18 | Disposition: A | Discharge status: Home / Self Care | Payer: 59 | Attending: Emergency Medicine | Admitting: Emergency Medicine

## 2021-09-18 ENCOUNTER — Other Ambulatory Visit: Payer: Self-pay

## 2021-09-18 ENCOUNTER — Encounter (HOSPITAL_BASED_OUTPATIENT_CLINIC_OR_DEPARTMENT_OTHER): Payer: Self-pay | Admitting: Emergency Medicine

## 2021-09-18 DIAGNOSIS — R197 Diarrhea, unspecified: Secondary | ICD-10-CM | POA: Diagnosis not present

## 2021-09-18 DIAGNOSIS — R519 Headache, unspecified: Secondary | ICD-10-CM | POA: Insufficient documentation

## 2021-09-18 DIAGNOSIS — Z87891 Personal history of nicotine dependence: Secondary | ICD-10-CM | POA: Diagnosis not present

## 2021-09-18 DIAGNOSIS — I129 Hypertensive chronic kidney disease with stage 1 through stage 4 chronic kidney disease, or unspecified chronic kidney disease: Secondary | ICD-10-CM | POA: Insufficient documentation

## 2021-09-18 DIAGNOSIS — K921 Melena: Secondary | ICD-10-CM | POA: Insufficient documentation

## 2021-09-18 DIAGNOSIS — Z79899 Other long term (current) drug therapy: Secondary | ICD-10-CM | POA: Diagnosis not present

## 2021-09-18 DIAGNOSIS — N189 Chronic kidney disease, unspecified: Secondary | ICD-10-CM | POA: Insufficient documentation

## 2021-09-18 LAB — COMPREHENSIVE METABOLIC PANEL
ALT: 17 U/L (ref 0–44)
AST: 15 U/L (ref 15–41)
Albumin: 4.3 g/dL (ref 3.5–5.0)
Alkaline Phosphatase: 72 U/L (ref 38–126)
Anion gap: 11 (ref 5–15)
BUN: 18 mg/dL (ref 8–23)
CO2: 24 mmol/L (ref 22–32)
Calcium: 9.6 mg/dL (ref 8.9–10.3)
Chloride: 103 mmol/L (ref 98–111)
Creatinine, Ser: 1.12 mg/dL (ref 0.61–1.24)
GFR, Estimated: >60 mL/min (ref >60)
Glucose, Bld: 112 mg/dL — ABNORMAL HIGH (ref 70–99)
Potassium: 3.3 mmol/L — ABNORMAL LOW (ref 3.5–5.1)
Sodium: 138 mmol/L (ref 135–145)
Total Bilirubin: 0.8 mg/dL (ref 0.3–1.2)
Total Protein: 7.4 g/dL (ref 6.5–8.1)

## 2021-09-18 LAB — CBC
HCT: 44.4 % (ref 39.0–52.0)
Hemoglobin: 15.6 g/dL (ref 13.0–17.0)
MCH: 30.6 pg (ref 26.0–34.0)
MCHC: 35.1 g/dL (ref 30.0–36.0)
MCV: 87.1 fL (ref 80.0–100.0)
Platelets: 182 10*3/uL (ref 150–400)
RBC: 5.1 MIL/uL (ref 4.22–5.81)
RDW: 12.2 % (ref 11.5–15.5)
WBC: 12.4 10*3/uL — ABNORMAL HIGH (ref 4.0–10.5)
nRBC: 0 % (ref 0.0–0.2)

## 2021-09-18 NOTE — ED Triage Notes (Signed)
Pt reports bright red rectal bleeding with diarrhea since Thursday.  Pt reports 6-8 BM/day.

## 2021-09-18 NOTE — ED Provider Notes (Signed)
[facility] Provider Note  CSN: [id number] Arrival date & time: 09/18/21 1801    History Chief Complaint  Patient presents with   Rectal Bleeding    Travis Burgess is a 65 y.o. male reports 2 days of general malaise, headaches and diarrhea, which has occasionally been bloody. No known fever but had some chills. He has not had any vomiting, abdominal pain or feeling lightheaded. He does not take any blood thinners. His last episode of diarrhea was about 6 hours prior to the time of my evaluation.    Past Medical History:  Diagnosis Date   Chronic kidney disease    kidney stones   Hypertension     Past Surgical History:  Procedure Laterality Date   BACK SURGERY  25 years ago   herniated disc   BACK SURGERY     CYSTOSCOPY W/ URETERAL STENT PLACEMENT  12/13/2011   Procedure: CYSTOSCOPY WITH RETROGRADE PYELOGRAM/URETERAL STENT PLACEMENT;  Surgeon: Ailene Rud, MD;  Location: [facility];  Service: Urology;  Laterality: Left;   NOSE SURGERY  35 years ago   broken nose playing soccer    No family history on file.  Social History   Tobacco Use   Smoking status: Former    Types: Cigarettes    Quit date: 12/12/1980    Years since quitting: 40.7   Smokeless tobacco: Never  Substance Use Topics   Alcohol use: Yes    Comment: monthly   Drug use: No     Home Medications Prior to Admission medications   Medication Sig Start Date End Date Taking? Authorizing Provider  atorvastatin (LIPITOR) 10 MG tablet Take 10 mg by mouth daily.   Yes [provider]  bacitracin ointment Apply 1 application topically 2 (two) times daily. 05/25/21   Rancour, Annie Main, MD  cephALEXin (KEFLEX) 500 MG capsule Take 1 capsule (500 mg total) by mouth 3 (three) times daily. 05/25/21   Rancour, Annie Main, MD  Cyanocobalamin (VITAMIN B-12) 2500 MCG SUBL Place 1 tablet under the tongue daily.    [provider]  doxycycline (VIBRAMYCIN) 100 MG capsule Take  1 capsule (100 mg total) by mouth 2 (two) times daily. 05/25/21   Rancour, Annie Main, MD  lisinopril-hydrochlorothiazide (PRINZIDE,ZESTORETIC) 10-12.5 MG per tablet Take 1 tablet by mouth daily before breakfast.     [provider]  oxymetazoline (AFRIN) 0.05 % nasal spray Place 2 sprays into the nose 2 (two) times daily as needed. Allergies    [provider]  Theanine 100 MG CAPS Take 1 capsule by mouth daily.    [provider]     Allergies    Patient has no known allergies.   Review of Systems   Review of Systems A comprehensive review of systems was completed and negative except as noted in HPI.    Physical Exam BP (!) 158/95 (BP Location: Right Arm)   Pulse 92   Temp 97.8 F (36.6 C) (Tympanic)   Resp 20   SpO2 99%   Physical Exam Vitals and nursing note reviewed.  Constitutional:      Appearance: Normal appearance.  HENT:     Head: Normocephalic and atraumatic.     Nose: Nose normal.     Mouth/Throat:     Mouth: Mucous membranes are moist.  Eyes:     Extraocular Movements: Extraocular movements intact.     Conjunctiva/sclera: Conjunctivae normal.  Cardiovascular:     Rate and Rhythm: Normal rate.  Pulmonary:  Effort: Pulmonary effort is normal.     Breath sounds: Normal breath sounds.  Abdominal:     General: Abdomen is flat. Bowel sounds are normal. There is no distension.     Palpations: Abdomen is soft.     Tenderness: There is no abdominal tenderness. There is no guarding or rebound.  Musculoskeletal:        General: No swelling. Normal range of motion.     Cervical back: Neck supple.  Skin:    General: Skin is warm and dry.  Neurological:     General: No focal deficit present.     Mental Status: He is alert.  Psychiatric:        Mood and Affect: Mood normal.     ED Results / Procedures / Treatments   Labs (all labs ordered are listed, but only abnormal results are displayed) Labs Reviewed  COMPREHENSIVE METABOLIC  PANEL - Abnormal; Notable for the following components:      Result Value   Potassium 3.3 (*)    Glucose, Bld 112 (*)    All other components within normal limits  CBC - Abnormal; Notable for the following components:   WBC 12.4 (*)    All other components within normal limits    EKG None   Radiology No results found.  Procedures Procedures  Medications Ordered in the ED Medications - No data to display   MDM Rules/Calculators/A&P MDM   ED Course  I have reviewed the triage vital signs and the nursing notes.  Pertinent labs & imaging results that were available during my care of the patient were reviewed by me and considered in my medical decision making (see chart for details).  Clinical Course as of 09/18/21 2202  Sat Sep 18, 2021  2159 Patient with symptoms of enteritis with some bloody diarrhea. He is not febrile, not having frequent diarrhea, abdomen is benign and his CBC shows normal Hgb. WBC is mildly elevated but he otherwise looks well and would like to go home. Suspect a self-limited infection, recommend he follow up with PCP or [facility] if symptoms worsen.  [CS]    Clinical Course User Index [CS] Truddie Hidden, MD    Final Clinical Impression(s) / ED Diagnoses Final diagnoses:  Bloody diarrhea    Rx / DC Orders ED Discharge Orders     None        Truddie Hidden, MD 09/18/21 2202

## 2022-01-28 NOTE — Progress Notes (Incomplete)
Phone: [phone]   Subjective:  Patient presents today to establish care.  Prior patient of ***.  No chief complaint on file.   See problem oriented charting  The following were reviewed and entered/updated in epic: Past Medical History:  Diagnosis Date   Chronic kidney disease    kidney stones   Hypertension    There are no problems to display for this patient.  Past Surgical History:  Procedure Laterality Date   BACK SURGERY  25 years ago   herniated disc   BACK SURGERY     CYSTOSCOPY W/ URETERAL STENT PLACEMENT  12/13/2011   Procedure: CYSTOSCOPY WITH RETROGRADE PYELOGRAM/URETERAL STENT PLACEMENT;  Surgeon: Ailene Rud, MD;  Location: [facility];  Service: Urology;  Laterality: Left;   NOSE SURGERY  35 years ago   broken nose playing soccer    No family history on file.  Medications- reviewed and updated Current Outpatient Medications  Medication Sig Dispense Refill   atorvastatin (LIPITOR) 10 MG tablet Take 10 mg by mouth daily.     bacitracin ointment Apply 1 application topically 2 (two) times daily. 120 g 0   cephALEXin (KEFLEX) 500 MG capsule Take 1 capsule (500 mg total) by mouth 3 (three) times daily. 21 capsule 0   Cyanocobalamin (VITAMIN B-12) 2500 MCG SUBL Place 1 tablet under the tongue daily.     doxycycline (VIBRAMYCIN) 100 MG capsule Take 1 capsule (100 mg total) by mouth 2 (two) times daily. 20 capsule 0   lisinopril-hydrochlorothiazide (PRINZIDE,ZESTORETIC) 10-12.5 MG per tablet Take 1 tablet by mouth daily before breakfast.      oxymetazoline (AFRIN) 0.05 % nasal spray Place 2 sprays into the nose 2 (two) times daily as needed. Allergies     Theanine 100 MG CAPS Take 1 capsule by mouth daily.     No current facility-administered medications for this visit.    Allergies-reviewed and updated No Known Allergies  Social History   Social History Narrative   Not on file    Objective  Objective:  There were no vitals taken for this  visit. Gen: NAD, resting comfortably HEENT: Mucous membranes are moist. Oropharynx normal. TM normal. Eyes: sclera and lids normal, PERRLA Neck: no thyromegaly, no cervical lymphadenopathy CV: RRR no murmurs rubs or gallops Lungs: CTAB no crackles, wheeze, rhonchi Abdomen: soft/nontender/nondistended/normal bowel sounds. No rebound or guarding.  Ext: no edema Skin: warm, dry Neuro: 5/5 strength in upper and lower extremities, normal gait, normal reflexes ***   Assessment and Plan:   # ED F/U for rectal bleeding S:Pt was seen by 08/2021 for an evaluation of rectal bleeding. He also reported x2 days of general malaise, headaches and bloody diarrhea. He had not any vomiting, abdominal pain or lightheadedness. He does not take any blood thinners.   A/P: ***     No problem-specific Assessment & Plan notes found for this encounter.  Health Maintenance Due  Topic Date Due   COVID-19 Vaccine (1) Never done   Hepatitis C Screening  Never done   COLONOSCOPY (Pts 45-32yrs Insurance coverage will need to be confirmed)  Never done   Zoster Vaccines- Shingrix (1 of 2) Never done   Pneumonia Vaccine 62+ Years old (1 - PCV) Never done   INFLUENZA VACCINE  06/28/2021    Recommended follow up: No follow-ups on file. Future Appointments  Date Time Provider [place]  02/08/2022  8:20 AM Marin Olp, MD [facility]    No orders of the defined types were  placed in this encounter.   Time Spent: *** minutes of total time (1:30 PM***- 1:30 PM***) was spent on the date of the encounter performing the following actions: chart review prior to seeing the patient, obtaining history, performing a medically necessary exam, counseling on the treatment plan, placing orders, and documenting in our EHR.    I,Jada Bradford,acting as a scribe for Garret Reddish, MD.,have documented all relevant documentation on the behalf of Garret Reddish, MD,as directed by  Garret Reddish, MD while in the  presence of Garret Reddish, MD.  *** Return precautions advised. Burnett Corrente

## 2022-02-08 ENCOUNTER — Encounter: Payer: Self-pay | Admitting: Family Medicine

## 2022-02-08 ENCOUNTER — Ambulatory Visit: Payer: 59 | Admitting: Family Medicine

## 2022-02-08 VITALS — BP 132/78 | HR 86 | Temp 98.0°F | Ht 71.0 in | Wt 258.8 lb

## 2022-02-08 DIAGNOSIS — E785 Hyperlipidemia, unspecified: Secondary | ICD-10-CM

## 2022-02-08 DIAGNOSIS — I10 Essential (primary) hypertension: Secondary | ICD-10-CM | POA: Diagnosis not present

## 2022-02-08 DIAGNOSIS — Z1211 Encounter for screening for malignant neoplasm of colon: Secondary | ICD-10-CM

## 2022-02-08 DIAGNOSIS — R0683 Snoring: Secondary | ICD-10-CM | POA: Diagnosis not present

## 2022-02-08 MED ORDER — ATORVASTATIN CALCIUM 10 MG PO TABS: 10.0000 mg | ORAL_TABLET | Freq: Every day | ORAL | 3 refills | Status: DC

## 2022-02-08 MED ORDER — LISINOPRIL-HYDROCHLOROTHIAZIDE 10-12.5 MG PO TABS: 1.0000 | ORAL_TABLET | Freq: Every day | ORAL | 3 refills | Status: DC

## 2022-02-08 NOTE — Patient Instructions (Addendum)
[facility] contact ?Please call to schedule visit and/or procedure ?Address: [address], [place], [place] [address] ?Phone: [phone]  ? ?We are trying to check with [facility] about shingrix date- if mychart still shows you are due in a week please send [place] the dates.  ? ?Sign ROI at the front desk to have records from [facility] sent to [place]- want at least labs and office visits for last 2 years and all immunizations ? ?I like your idea of getting a jump start of cutting out some of the unhealthy foods and cutting down on carbs since that has worked for you ?-if you feel you hit a bump in the road on progress- consider myfitnesspal app on your phone. Do not link your step counter.  ? ? ?Recommended follow up: schedule 1 year out from last physical with Dr. Felipa Eth ?

## 2022-02-14 MED ORDER — LISINOPRIL-HYDROCHLOROTHIAZIDE 20-12.5 MG PO TABS: 1.0000 | ORAL_TABLET | Freq: Every day | ORAL | 3 refills | Status: DC

## 2022-02-14 NOTE — Addendum Note (Signed)
Addended by: Marin Olp on: 02/14/2022 10:39 AM ? ? Modules accepted: Orders ? ?

## 2022-03-18 ENCOUNTER — Institutional Professional Consult (permissible substitution): Payer: 59 | Admitting: Acute Care

## 2022-04-13 ENCOUNTER — Telehealth: Payer: Self-pay | Admitting: Gastroenterology

## 2022-04-13 NOTE — Telephone Encounter (Signed)
Good morning Dr. Bryan Lemma, ? ?DoD 5/12 p.m. ? ?We received records from [facility] on this patient who is requesting a transfer of care.  These records will be forwarded to you for your review.  Please let me know if you approve the transfer. ? ?Thank you. ?

## 2022-04-19 NOTE — Telephone Encounter (Signed)
Records received and reviewed and notable for the following:  - Colonoscopy (06/21/2012, Dr. Wynetta Emery): Normal.  Repeat in 10 years   No other records were sent from [facility] for review to include office notes, etc. I do see that he was seen by his PCM on 02/08/2022 and part of evaluation for follow-up from rectal bleeding from the ER in 08/2021.  Bleeding had resolved.  Referral was placed to [place] more so for ongoing colon cancer screening.  If the patient has active GI symptoms or other questions/concerns and would like an office appointment, I am happy to see him in the office at next available.  Otherwise, if this is more so for routine CRC screening, it is reasonable to schedule as direct access colonoscopy with me.

## 2022-04-21 ENCOUNTER — Ambulatory Visit (AMBULATORY_SURGERY_CENTER): Payer: Self-pay | Admitting: *Deleted

## 2022-04-21 VITALS — Ht 71.0 in | Wt 261.0 lb

## 2022-04-21 DIAGNOSIS — Z1211 Encounter for screening for malignant neoplasm of colon: Secondary | ICD-10-CM

## 2022-04-21 MED ORDER — NA SULFATE-K SULFATE-MG SULF 17.5-3.13-1.6 GM/177ML PO SOLN: 2.0000 | Freq: Once | ORAL | 0 refills | Status: AC

## 2022-04-21 NOTE — Progress Notes (Signed)
No egg or soy allergy known to patient  No issues known to pt with past sedation with any surgeries or procedures Patient denies ever being told they had issues or difficulty with intubation  No FH of Malignant Hyperthermia Pt is not on diet pills Pt is not on  home 02  Pt is not on blood thinners  Pt denies issues with constipation  No A fib or A flutter   Pt instructed to use Singlecare.com or GoodRx for a price reduction on prep   PV completed in person. Pt verified name, DOB, address and insurance during PV today.  Pt given instruction packet with copy of consent form to read and sign after instructions explained and questions answered.  Pt encouraged to call with questions or issues.   Insurance confirmed with pt at [facility] today

## 2022-05-03 ENCOUNTER — Encounter: Payer: Self-pay | Admitting: Gastroenterology

## 2022-05-12 ENCOUNTER — Ambulatory Visit (AMBULATORY_SURGERY_CENTER): Payer: 59 | Admitting: Gastroenterology

## 2022-05-12 ENCOUNTER — Encounter: Payer: Self-pay | Admitting: Gastroenterology

## 2022-05-12 VITALS — BP 106/70 | HR 73 | Temp 97.8°F | Resp 19 | Ht 71.0 in | Wt 261.0 lb

## 2022-05-12 DIAGNOSIS — D124 Benign neoplasm of descending colon: Secondary | ICD-10-CM

## 2022-05-12 DIAGNOSIS — D122 Benign neoplasm of ascending colon: Secondary | ICD-10-CM | POA: Diagnosis not present

## 2022-05-12 DIAGNOSIS — D125 Benign neoplasm of sigmoid colon: Secondary | ICD-10-CM | POA: Diagnosis not present

## 2022-05-12 DIAGNOSIS — K635 Polyp of colon: Secondary | ICD-10-CM

## 2022-05-12 DIAGNOSIS — Z1211 Encounter for screening for malignant neoplasm of colon: Secondary | ICD-10-CM | POA: Diagnosis present

## 2022-05-12 DIAGNOSIS — K5730 Diverticulosis of large intestine without perforation or abscess without bleeding: Secondary | ICD-10-CM

## 2022-05-12 DIAGNOSIS — K641 Second degree hemorrhoids: Secondary | ICD-10-CM

## 2022-05-12 MED ORDER — SODIUM CHLORIDE 0.9 % IV SOLN: 500.0000 mL | Freq: Once | INTRAVENOUS | Status: DC

## 2022-05-12 NOTE — Progress Notes (Signed)
Called to room to assist during endoscopic procedure.  Patient ID and intended procedure confirmed with present staff. Received instructions for my participation in the procedure from the performing physician.  

## 2022-05-12 NOTE — Op Note (Signed)
[place] Patient Name: Dewarren Ledbetter Procedure Date: 05/12/2022 1:25 PM MRN: [id number] Endoscopist: Gerrit Heck , MD Age: 66 Referring MD:  Date of Birth: 05-06-1956 Gender: Male Account #: [id number] Procedure:                Colonoscopy Indications:              Screening for colorectal malignant neoplasm (last                            colonoscopy was 10 years ago)                           Last Colonoscopy (06/21/2012, Dr. Wynetta Emery): Normal Medicines:                Monitored Anesthesia Care Procedure:                Pre-Anesthesia Assessment:                           - Prior to the procedure, a History and Physical                            was performed, and patient medications and                            allergies were reviewed. The patient's tolerance of                            previous anesthesia was also reviewed. The risks                            and benefits of the procedure and the sedation                            options and risks were discussed with the patient.                            All questions were answered, and informed consent                            was obtained. Prior Anticoagulants: The patient has                            taken no previous anticoagulant or antiplatelet                            agents. ASA Grade Assessment: II - A patient with                            mild systemic disease. After reviewing the risks                            and benefits, the patient was deemed in  satisfactory condition to undergo the procedure.                           After obtaining informed consent, the colonoscope                            was passed under direct vision. Throughout the                            procedure, the patient's blood pressure, pulse, and                            oxygen saturations were monitored continuously. The                            Olympus CF-HQ190L [id number])  Colonoscope was                            introduced through the anus and advanced to the the                            terminal ileum. The colonoscopy was performed                            without difficulty. The patient tolerated the                            procedure well. The quality of the bowel                            preparation was good. The terminal ileum, ileocecal                            valve, appendiceal orifice, and rectum were                            photographed. Scope In: 1:36:47 PM Scope Out: 1:51:12 PM Scope Withdrawal Time: 0 hours 12 minutes 54 seconds  Total Procedure Duration: 0 hours 14 minutes 25 seconds  Findings:                 Hemorrhoids were found on perianal exam.                           Two sessile polyps were found in the ascending                            colon. The polyps were 3 to 6 mm in size. These                            polyps were removed with a cold snare. Resection                            and retrieval were complete. Estimated blood loss  was minimal.                           Two sessile polyps were found in the sigmoid colon                            and descending colon. The polyps were 3 to 5 mm in                            size. These polyps were removed with a cold snare.                            Resection and retrieval were complete. Estimated                            blood loss was minimal.                           There was a small lipoma, in the proximal                            transverse colon.                           Multiple small and large-mouthed diverticula were                            found in the sigmoid colon.                           Non-bleeding internal hemorrhoids were found during                            retroflexion. The hemorrhoids were small and Grade                            II (internal hemorrhoids that prolapse but reduce                             spontaneously).                           The terminal ileum appeared normal. Complications:            No immediate complications. Estimated Blood Loss:     Estimated blood loss was minimal. Impression:               - Hemorrhoids found on perianal exam.                           - Two 3 to 6 mm polyps in the ascending colon,                            removed with a cold snare. Resected and retrieved.                           -  Two 3 to 5 mm polyps in the sigmoid colon and in                            the descending colon, removed with a cold snare.                            Resected and retrieved.                           - Small lipoma in the proximal transverse colon.                           - Diverticulosis in the sigmoid colon.                           - Non-bleeding internal hemorrhoids.                           - The examined portion of the ileum was normal. Recommendation:           - Patient has a contact number available for                            emergencies. The signs and symptoms of potential                            delayed complications were discussed with the                            patient. Return to normal activities tomorrow.                            Written discharge instructions were provided to the                            patient.                           - Resume previous diet.                           - Continue present medications.                           - Await pathology results.                           - Repeat colonoscopy for surveillance based on                            pathology results.                           - Return to GI office PRN. Gerrit Heck, MD 05/12/2022 2:00:53 PM

## 2022-05-12 NOTE — Progress Notes (Signed)
GASTROENTEROLOGY PROCEDURE H&P NOTE   Primary Care Physician: Marin Olp, MD    Reason for Procedure:  Colon Cancer screening  Plan:    Colonoscopy  Patient is appropriate for endoscopic procedure(s) in the ambulatory ([facility]) setting.  The nature of the procedure, as well as the risks, benefits, and alternatives were carefully and thoroughly reviewed with the patient. Ample time for discussion and questions allowed. The patient understood, was satisfied, and agreed to proceed.     HPI: Travis Burgess is a 66 y.o. male who presents for colonoscopy for ongoing Colon Cancer screening.  No active GI symptoms.    - Colonoscopy (06/21/2012, Dr. Wynetta Emery): Normal.  Repeat in 10 years  Past Medical History:  Diagnosis Date   Chronic kidney disease    kidney stones   GERD (gastroesophageal reflux disease)    Hyperlipidemia    Hypertension    Pneumothorax    chest tube around age 68    Past Surgical History:  Procedure Laterality Date   BACK SURGERY     herniated disc around age 27   CYSTOSCOPY W/ URETERAL STENT PLACEMENT  12/13/2011   kidney stone. Procedure: CYSTOSCOPY WITH RETROGRADE PYELOGRAM/URETERAL STENT PLACEMENT;  Surgeon: Ailene Rud, MD;  Location: [facility];  Service: Urology;  Laterality: Left;   NOSE SURGERY  35 years ago   broken nose playing soccer    Prior to Admission medications   Medication Sig Start Date End Date Taking? Authorizing Provider  atorvastatin (LIPITOR) 10 MG tablet Take 1 tablet (10 mg total) by mouth daily. 02/08/22  Yes Marin Olp, MD  lisinopril-hydrochlorothiazide (ZESTORETIC) 20-12.5 MG tablet Take 1 tablet by mouth daily. 02/14/22  Yes Marin Olp, MD    Current Outpatient Medications  Medication Sig Dispense Refill   atorvastatin (LIPITOR) 10 MG tablet Take 1 tablet (10 mg total) by mouth daily. 90 tablet 3   lisinopril-hydrochlorothiazide (ZESTORETIC) 20-12.5 MG tablet Take 1 tablet by mouth daily. 90 tablet 3    Current Facility-Administered Medications  Medication Dose Route Frequency Provider Last Rate Last Admin   0.9 %  sodium chloride infusion  500 mL Intravenous Once Winry Egnew V, DO        Allergies as of 05/12/2022   (No Known Allergies)    Family History  Problem Relation Age of Onset   Dementia Mother        started in 44s   CAD Father        bypass at 41 or 69. cigars. died 64-87   Healthy Sister    Hyperlipidemia Brother    Hypothyroidism Brother    Hyperlipidemia Maternal Grandfather    CAD Maternal Grandfather    Heart disease Paternal Grandfather        died rather young- 24s   Colon polyps Neg Hx    Colon cancer Neg Hx    Esophageal cancer Neg Hx    Stomach cancer Neg Hx    Rectal cancer Neg Hx     Social History   Socioeconomic History   Marital status: Married    Spouse name: Not on file   Number of children: Not on file   Years of education: Not on file   Highest education level: Not on file  Occupational History   Not on file  Tobacco Use   Smoking status: Former    Packs/day: 0.50    Years: 6.00    Total pack years: 3.00    Types: Cigarettes  Quit date: 12/12/1980    Years since quitting: 41.4   Smokeless tobacco: Never  Vaping Use   Vaping Use: Never used  Substance and Sexual Activity   Alcohol use: Yes    Comment: maybe one a month or so   Drug use: No   Sexual activity: Yes  Other Topics Concern   Not on file  Social History Narrative   Married. 3 kids. 7 grandkids.       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      Hobbies: enjoys his work, Art gallery manager, Haematologist, has enjoyed walking in past, Higher education careers adviser, time with family/grandkids   Social Determinants of Radio broadcast assistant Strain: Not on file  Food Insecurity: Not on file  Transportation Needs: Not on file  Physical Activity: Not on file  Stress: Not on file  Social Connections: Not on  file  Intimate Partner Violence: Not on file    Physical Exam: Vital signs in last 24 hours: '@BP'$  135/70   Pulse (!) 102   Temp 97.8 F (36.6 C)   Ht '5\' 11"'$  (1.803 m)   Wt 261 lb (118.4 kg)   SpO2 95%   BMI 36.40 kg/m  GEN: NAD EYE: Sclerae anicteric ENT: MMM CV: Non-tachycardic Pulm: CTA b/l GI: Soft, NT/ND NEURO:  Alert & Oriented x [place], DO [facility]   05/12/2022 1:23 PM

## 2022-05-12 NOTE — Patient Instructions (Signed)
Handouts provided on polyps, diverticulosis and hemorrhoids.   YOU HAD AN ENDOSCOPIC PROCEDURE TODAY AT [facility]:   Refer to the procedure report that was given to you for any specific questions about what was found during the examination.  If the procedure report does not answer your questions, please call your gastroenterologist to clarify.  If you requested that your care partner not be given the details of your procedure findings, then the procedure report has been included in a sealed envelope for you to review at your convenience later.  YOU SHOULD EXPECT: Some feelings of bloating in the abdomen. Passage of more gas than usual.  Walking can help get rid of the air that was put into your GI tract during the procedure and reduce the bloating. If you had a lower endoscopy (such as a colonoscopy or flexible sigmoidoscopy) you may notice spotting of blood in your stool or on the toilet paper. If you underwent a bowel prep for your procedure, you may not have a normal bowel movement for a few days.  Please Note:  You might notice some irritation and congestion in your nose or some drainage.  This is from the oxygen used during your procedure.  There is no need for concern and it should clear up in a day or so.  SYMPTOMS TO REPORT IMMEDIATELY:  Following lower endoscopy (colonoscopy or flexible sigmoidoscopy):  Excessive amounts of blood in the stool  Significant tenderness or worsening of abdominal pains  Swelling of the abdomen that is new, acute  Fever of 100F or higher  For urgent or emergent issues, a gastroenterologist can be reached at any hour by calling [phone]. Do not use MyChart messaging for urgent concerns.    DIET:  We do recommend a small meal at first, but then you may proceed to your regular diet.  Drink plenty of fluids but you should avoid alcoholic beverages for 24 hours.  ACTIVITY:  You should plan to take it easy for the rest of today and you  should NOT DRIVE or use heavy machinery until tomorrow (because of the sedation medicines used during the test).    FOLLOW UP: Our staff will call the number listed on your records 24-72 hours following your procedure to check on you and address any questions or concerns that you may have regarding the information given to you following your procedure. If we do not reach you, we will leave a message.  We will attempt to reach you two times.  During this call, we will ask if you have developed any symptoms of COVID 19. If you develop any symptoms (ie: fever, flu-like symptoms, shortness of breath, cough etc.) before then, please call [phone].  If you test positive for Covid 19 in the 2 weeks post procedure, please call and report this information to [place].    If any biopsies were taken you will be contacted by phone or by letter within the next 1-3 weeks.  Please call us at [phone] if you have not heard about the biopsies in 3 weeks.    SIGNATURES/CONFIDENTIALITY: You and/or your care partner have signed paperwork which will be entered into your electronic medical record.  These signatures attest to the fact that that the information above on your After Visit Summary has been reviewed and is understood.  Full responsibility of the confidentiality of this discharge information lies with you and/or your care-partner.

## 2022-05-12 NOTE — Progress Notes (Signed)
Sedate, gd SR, tolerated procedure well, VSS, report to RN 

## 2022-05-12 NOTE — Progress Notes (Signed)
Pt's states no medical or surgical changes since previsit or office visit. 

## 2022-05-13 ENCOUNTER — Telehealth: Payer: Self-pay

## 2022-05-13 NOTE — Telephone Encounter (Signed)
  Follow up Call-     05/12/2022   12:55 PM  Call back number  Post procedure Call Back phone  # [phone] cell  Permission to leave phone message Yes     Patient questions:  Do you have a fever, pain , or abdominal swelling? No. Pain Score  0 *  Have you tolerated food without any problems? Yes.    Have you been able to return to your normal activities? Yes.    Do you have any questions about your discharge instructions: Diet   No. Medications  No. Follow up visit  No.  Do you have questions or concerns about your Care? No.  Actions: * If pain score is 4 or above: No action needed, pain <4.

## 2022-05-19 ENCOUNTER — Encounter: Payer: 59 | Admitting: Gastroenterology

## 2022-05-25 ENCOUNTER — Encounter: Payer: Self-pay | Admitting: Gastroenterology

## 2022-08-22 ENCOUNTER — Encounter: Payer: Self-pay | Admitting: *Deleted

## 2022-11-10 ENCOUNTER — Encounter: Payer: Self-pay | Admitting: *Deleted

## 2022-11-15 ENCOUNTER — Ambulatory Visit: Payer: 59 | Admitting: Internal Medicine

## 2022-11-15 ENCOUNTER — Encounter: Payer: Self-pay | Admitting: Internal Medicine

## 2022-11-15 VITALS — BP 150/77 | HR 76 | Temp 97.8°F | Resp 12 | Ht 71.0 in | Wt 256.2 lb

## 2022-11-15 DIAGNOSIS — J324 Chronic pansinusitis: Secondary | ICD-10-CM | POA: Diagnosis not present

## 2022-11-15 DIAGNOSIS — J310 Chronic rhinitis: Secondary | ICD-10-CM

## 2022-11-15 DIAGNOSIS — T485X5A Adverse effect of other anti-common-cold drugs, initial encounter: Secondary | ICD-10-CM

## 2022-11-15 MED ORDER — PSEUDOEPHEDRINE HCL ER 120 MG PO TB12: 120.0000 mg | ORAL_TABLET | Freq: Two times a day (BID) | ORAL | 0 refills | Status: DC

## 2022-11-15 MED ORDER — LORATADINE 10 MG PO TABS: 10.0000 mg | ORAL_TABLET | Freq: Every day | ORAL | 11 refills | Status: DC

## 2022-11-15 MED ORDER — FLUTICASONE PROPIONATE 50 MCG/ACT NA SUSP: 2.0000 | Freq: Every day | NASAL | 6 refills | Status: DC

## 2022-11-15 MED ORDER — SIMPLY SALINE 0.9 % NA AERS: 2.0000 | INHALATION_SPRAY | NASAL | 11 refills | Status: AC

## 2022-11-15 NOTE — Progress Notes (Signed)
[name]: [phone]   Acute Care Medical Office Visit  Patient:  Travis Burgess      Age: 66 y.o.       Sex:  male  Date:   11/15/2022  PCP:    Marin Olp, [place] Provider: Loralee Pacas, MD  Assessment/Plan:     ICD-10-CM   1. Chronic pansinusitis  J32.4 fluticasone (FLONASE) 50 MCG/ACT nasal spray    Saline (SIMPLY SALINE) 0.9 % AERS    loratadine (CLARITIN) 10 MG tablet    pseudoephedrine (SUDAFED 12 HOUR) 120 MG 12 hr tablet    2. Rhinitis medicamentosa  J31.0    T48.5X5A        Sinus infection/Sinusitis Not likely  bacterial based on: Symptoms >10 days, double sickening, or severe symptoms in first 3 days No Antibiotic indicated at present- but we agreed I would call something in if symptoms worsen after improving or last past 10 days   Most likely viral based on <10 days, no double sickening, lack of severity of symptoms in first 3 days. Educated on signs that bacterial infection may have developed (symptoms over 10 days, double sickening).    that all symptom(s) mostly gone now.  Treatment:  Medications listed above: Choice of decongestants: mucinex or mucinex-DM, robitussin, dayquil, nyquil Sudafed good  for rhinorrhea and post nasal drip but prefer avoidance nasal ipratropium and oxymetazoline due to dependency concerns Aggressive sinus rinses preferably with misting saline can spray, followed by steroid nasal spray Advised him  to discontinue oxymetazoline nasal spray- risks explained    Infection Control: We discussed the importance of daily sinus rinsing, hand hygiene, cough hygiene, masks, and quarantining to mitigate the spread of illness.  Follow up as needed, based on symptoms and resolution, go to ER if any difficulty catching breath.     Subjective:   Travis Burgess is a 66 y.o. male with past medical history significant for Patient Active Problem List   Diagnosis Date Noted   Hyperlipidemia  02/08/2022   Essential hypertension 02/08/2022    Reviewed prior medications as listed: Outpatient Medications Prior to Visit  Medication Sig   atorvastatin (LIPITOR) 10 MG tablet Take 1 tablet (10 mg total) by mouth daily.   lisinopril-hydrochlorothiazide (ZESTORETIC) 20-12.5 MG tablet Take 1 tablet by mouth daily.   No facility-administered medications prior to visit.     He presented today for evaluation of: Chief Complaint  Patient presents with   Nasal Congestion    Stuffy-still lingering. Symptoms for about a week. No fever.   Fatigue   Chest cold   Cough    With some slight wheezing, has improved but lingering.   COVID test negative    At home, about four or five days ago.     HPI  -other symptoms include: congestion x 1 week(s), cough headache and sore throat that went away.  Still some coughing and wheezing feels like its going away.  Concerned that its not completely gone away -first symptoms of this illness appeared 7 days ago -Symptoms are improving -previous treatments: took blood pressure medication and dayquil/Nyquil a little. Sudafed.  Allergy.  s -sick contacts/travel/risks: denies flu/COVID exposure.  -Hx of: allergies, upper respiratory infections  ROS - denies fevers, illness-associated myalgias, shortness of breath, nausea, vomiting, diarrhea, tooth pain        Objective:  BP (!) 150/77 (BP Location: Right Arm, Patient Position: Sitting)   Pulse 76   Temp 97.8 F (36.6  C) (Temporal)   Resp 12   Ht '5\' 11"'$  (1.803 m)   Wt 256 lb 3.2 oz (116.2 kg)   SpO2 98%   BMI 35.73 kg/m  Physical Exam was problem focused only: vitals reviewed, nursing note reviewed. Obese male in no acute distress no acute distress,  awake alert and oriented,  normal work of breathing, lungs were clear to auscultation bilaterally Heart was Normal heart rate. Normal rhythm. No murmurs, rubs, or gallops.  Problem-specific findings:  stuffy sounding, wearing mask  Oral mucosa was  moist without significant tonsillar swelling or exudate Turbinates erythematous with yellow nasal drainage, TM normal, pharynx mildly erythematous with no tonsilar exudate or edema  Results Reviewed: Results for orders placed or performed in visit on 02/17/22  Microalbumin, urine  Result Value Ref Range   Microalb, Ur 0.7   CBC and differential  Result Value Ref Range   WBC 8.3   Microalbumin / creatinine urine ratio  Result Value Ref Range   Microalb Creat Ratio 8.5   Basic metabolic panel  Result Value Ref Range   Glucose 86    BUN 23 (A) 4 - 21   CO2 24 (A) 13 - 22   Creatinine 1.3 0.6 - 1.3   Potassium 4.7 3.5 - 5.1 mEq/L   Sodium 135 (A) 137 - 147   Chloride 99 99 - 108  Comprehensive metabolic panel  Result Value Ref Range   Calcium 10.0 8.7 - 10.7   Albumin 4.5 3.5 - 5.0  Lipid panel  Result Value Ref Range   Triglycerides 109 40 - 160   Cholesterol 131 0 - 200   HDL 41 35 - 70   LDL Cholesterol 70   Hepatic function panel  Result Value Ref Range   Alkaline Phosphatase 77 25 - 125   ALT 23 10 - 40 U/L   AST 20 14 - 40  PSA  Result Value Ref Range   PSA 1.25     No flu/COVID/strep swab needed as he is almost better and its over 7 days   Loralee Pacas, MD

## 2023-02-20 ENCOUNTER — Other Ambulatory Visit: Payer: Self-pay | Admitting: Family Medicine

## 2023-02-23 ENCOUNTER — Other Ambulatory Visit: Payer: Self-pay | Admitting: Family Medicine

## 2023-03-20 ENCOUNTER — Encounter: Payer: Self-pay | Admitting: Family Medicine

## 2023-03-20 ENCOUNTER — Other Ambulatory Visit (INDEPENDENT_AMBULATORY_CARE_PROVIDER_SITE_OTHER): Payer: 59

## 2023-03-20 ENCOUNTER — Ambulatory Visit (INDEPENDENT_AMBULATORY_CARE_PROVIDER_SITE_OTHER): Payer: 59 | Admitting: Family Medicine

## 2023-03-20 VITALS — BP 132/80 | HR 84 | Temp 98.3°F | Resp 16 | Ht 71.0 in | Wt 250.2 lb

## 2023-03-20 DIAGNOSIS — Z125 Encounter for screening for malignant neoplasm of prostate: Secondary | ICD-10-CM | POA: Diagnosis not present

## 2023-03-20 DIAGNOSIS — L989 Disorder of the skin and subcutaneous tissue, unspecified: Secondary | ICD-10-CM

## 2023-03-20 DIAGNOSIS — R82998 Other abnormal findings in urine: Secondary | ICD-10-CM

## 2023-03-20 DIAGNOSIS — E785 Hyperlipidemia, unspecified: Secondary | ICD-10-CM

## 2023-03-20 DIAGNOSIS — Z23 Encounter for immunization: Secondary | ICD-10-CM

## 2023-03-20 DIAGNOSIS — I2510 Atherosclerotic heart disease of native coronary artery without angina pectoris: Secondary | ICD-10-CM | POA: Insufficient documentation

## 2023-03-20 DIAGNOSIS — Z0000 Encounter for general adult medical examination without abnormal findings: Secondary | ICD-10-CM

## 2023-03-20 DIAGNOSIS — Z87891 Personal history of nicotine dependence: Secondary | ICD-10-CM

## 2023-03-20 LAB — URINALYSIS, ROUTINE W REFLEX MICROSCOPIC
Bilirubin Urine: NEGATIVE
Hgb urine dipstick: NEGATIVE
Ketones, ur: NEGATIVE
Leukocytes,Ua: NEGATIVE
Nitrite: NEGATIVE
RBC / HPF: NONE SEEN (ref 0–?)
Specific Gravity, Urine: 1.02 (ref 1.000–1.030)
Total Protein, Urine: NEGATIVE
Urine Glucose: NEGATIVE
Urobilinogen, UA: 0.2 (ref 0.0–1.0)
WBC, UA: NONE SEEN (ref 0–?)
pH: 6 (ref 5.0–8.0)

## 2023-03-20 LAB — COMPREHENSIVE METABOLIC PANEL
ALT: 56 U/L — ABNORMAL HIGH (ref 0–53)
AST: 32 U/L (ref 0–37)
Albumin: 4.8 g/dL (ref 3.5–5.2)
Alkaline Phosphatase: 79 U/L (ref 39–117)
BUN: 16 mg/dL (ref 6–23)
CO2: 24 mEq/L (ref 19–32)
Calcium: 9.8 mg/dL (ref 8.4–10.5)
Chloride: 99 mEq/L (ref 96–112)
Creatinine, Ser: 1.16 mg/dL (ref 0.40–1.50)
GFR: 65.25 mL/min (ref >60.00)
Glucose, Bld: 88 mg/dL (ref 70–99)
Potassium: 4.2 mEq/L (ref 3.5–5.1)
Sodium: 134 mEq/L — ABNORMAL LOW (ref 135–145)
Total Bilirubin: 0.9 mg/dL (ref 0.2–1.2)
Total Protein: 7.7 g/dL (ref 6.0–8.3)

## 2023-03-20 LAB — CBC WITH DIFFERENTIAL/PLATELET
Basophils Absolute: 0.1 10*3/uL (ref 0.0–0.1)
Basophils Relative: 0.5 % (ref 0.0–3.0)
Eosinophils Absolute: 0.2 10*3/uL (ref 0.0–0.7)
Eosinophils Relative: 1.4 % (ref 0.0–5.0)
HCT: 45.2 % (ref 39.0–52.0)
Hemoglobin: 15.8 g/dL (ref 13.0–17.0)
Lymphocytes Relative: 16.3 % (ref 12.0–46.0)
Lymphs Abs: 2.2 10*3/uL (ref 0.7–4.0)
MCHC: 35 g/dL (ref 30.0–36.0)
MCV: 88.8 fl (ref 78.0–100.0)
Monocytes Absolute: 0.9 10*3/uL (ref 0.1–1.0)
Monocytes Relative: 6.8 % (ref 3.0–12.0)
Neutro Abs: 10.1 10*3/uL — ABNORMAL HIGH (ref 1.4–7.7)
Neutrophils Relative %: 75 % (ref 43.0–77.0)
Platelets: 252 10*3/uL (ref 150.0–400.0)
RBC: 5.1 Mil/uL (ref 4.22–5.81)
RDW: 13.7 % (ref 11.5–15.5)
WBC: 13.4 10*3/uL — ABNORMAL HIGH (ref 4.0–10.5)

## 2023-03-20 LAB — MICROALBUMIN / CREATININE URINE RATIO
Creatinine,U: 85.4 mg/dL
Microalb Creat Ratio: 1.1 mg/g (ref 0.0–30.0)
Microalb, Ur: 0.9 mg/dL (ref 0.0–1.9)

## 2023-03-20 LAB — LIPID PANEL
Cholesterol: 155 mg/dL (ref 0–200)
HDL: 41.3 mg/dL (ref >39.00)
LDL Cholesterol: 86 mg/dL (ref 0–99)
NonHDL: 113.95
Total CHOL/HDL Ratio: 4
Triglycerides: 138 mg/dL (ref 0.0–149.0)
VLDL: 27.6 mg/dL (ref 0.0–40.0)

## 2023-03-20 LAB — PSA, MEDICARE: PSA: 1.88 ng/ml (ref 0.10–4.00)

## 2023-03-20 NOTE — Addendum Note (Signed)
Addended by: Laddie Aquas A on: 03/20/2023 03:04 PM   Modules accepted: Orders

## 2023-03-20 NOTE — Progress Notes (Signed)
Phone: [phone]   Subjective:  Patient presents today for their annual physical. Chief complaint-noted.   See problem oriented charting- ROS- full  review of systems was completed and negative  except for: focus issues, sinus pressure, allergies  The following were reviewed and entered/updated in epic: Past Medical History:  Diagnosis Date   Chronic kidney disease    kidney stones   GERD (gastroesophageal reflux disease)    Hyperlipidemia    Hypertension    Pneumothorax    chest tube around age 70   Patient Active Problem List   Diagnosis Date Noted   CAD (coronary artery disease) 03/20/2023    Priority: High   Hyperlipidemia 02/08/2022    Priority: Medium    Essential hypertension 02/08/2022    Priority: Medium    Past Surgical History:  Procedure Laterality Date   BACK SURGERY     herniated disc around age 34   CYSTOSCOPY W/ URETERAL STENT PLACEMENT  12/13/2011   kidney stone. Procedure: CYSTOSCOPY WITH RETROGRADE PYELOGRAM/URETERAL STENT PLACEMENT;  Surgeon: Kathi Ludwig, MD;  Location: [facility];  Service: Urology;  Laterality: Left;   NOSE SURGERY  35 years ago   broken nose playing soccer    Family History  Problem Relation Age of Onset   Dementia Mother        started in 3s   CAD Father        bypass at 90 or 79. cigars. died 64-87   Healthy Sister    Hyperlipidemia Brother    Hypothyroidism Brother    Hyperlipidemia Maternal Grandfather    CAD Maternal Grandfather    Heart disease Paternal Grandfather        died rather young- 32s   Colon polyps Neg Hx    Colon cancer Neg Hx    Esophageal cancer Neg Hx    Stomach cancer Neg Hx    Rectal cancer Neg Hx     Medications- reviewed and updated Current Outpatient Medications  Medication Sig Dispense Refill   atorvastatin (LIPITOR) 10 MG tablet TAKE 1 TABLET BY MOUTH EVERY DAY 30 tablet 11   fluticasone (FLONASE) 50 MCG/ACT nasal spray Place 2 sprays into both nostrils daily. 16 g 6    lisinopril-hydrochlorothiazide (ZESTORETIC) 20-12.5 MG tablet TAKE 1 TABLET BY MOUTH EVERY DAY 90 tablet 0   Saline (SIMPLY SALINE) 0.9 % AERS Place 2 each into the nose as directed. Use nightly for sinus hygiene long-term.  Can also be used as many times daily as desired to assist with clearing congested sinuses. 127 mL 11   No current facility-administered medications for this visit.    Allergies-reviewed and updated No Known Allergies  Social History   Social History Narrative   Married. 3 kids. 7 grandkids.       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 Oropharynx normal Neck: no thyromegaly CV: RRR no murmurs rubs or gallops Lungs: CTAB no crackles, wheeze, rhonchi Abdomen: soft/nontender/nondistended/normal bowel sounds. No rebound  or guarding.  Ext: no edema Skin: warm, dry Neuro: grossly normal, moves all extremities, PERRLA   Assessment and Plan  67 y.o. male presenting for annual physical.  Health Maintenance counseling: 1. Anticipatory guidance: Patient counseled regarding regular dental exams -q6 months, eye exams -yearly,  avoiding smoking and second hand smoke, limiting alcohol to 2 beverages per day - 3 a year, no illicit drugs .   2. Risk factor reduction:  Advised patient of need for regular exercise and diet rich and fruits and vegetables to reduce risk of heart attack and stroke.  Exercise- feels could improve- walking dog 30 minutes most days- had been higher in past and may increase Diet/weight management-Down 8 pounds from last  visit with me- has been working to improve diet.  Wt Readings from Last 3 Encounters:  03/20/23 250 lb 3.2 oz (113.5 kg)  11/15/22 256 lb 3.2 oz (116.2 kg)  05/12/22 261 lb (118.4 kg)  3. Immunizations/screenings/ancillary studies- offered prevnar 20 - opts in today , COVID declines Immunization History  Administered Date(s) Administered   Influenza Split 12/14/2011, 07/29/2016, 09/12/2019   Influenza,inj,Quad PF,6+ Mos 09/11/2021   PFIZER(Purple Top)SARS-COV-2 Vaccination 01/08/2020, 02/05/2020, 11/15/2020   Pfizer Covid-19 Vaccine Bivalent Booster 63yrs & up 08/30/2021   Pneumococcal Conjugate-13 04/15/2021   Tdap 05/25/2021   Zoster Recombinat (Shingrix) 05/08/2020, 10/16/2020  4. Prostate cancer screening-  check PSA and trend with labs today Lab Results  Component Value Date   PSA 1.25 04/15/2021   5. Colon cancer screening - history of sessile serrated polyp 04/2022 - 3 year repeat 6. Skin cancer screening- last seen by derm 10 years ago but does have lesion on mid back that is darker than other areas we want to have evaled- was seen by Dr. Gale Journey derm years ago- refer back today.  7. Smoking associated screening (lung cancer screening, AAA screen 65-75, UA)- former smoker- quit over 40 years ago- no regular screening needed other than 1x  8. STD screening - only active with wife  Status of chronic or acute concerns   #hypertension S: medication: Lisinopril hydrochlorothiazide 20-12.5 mg - trying to reduce salt but enjoys it Home readings #s: 150's but has kept arm down by his side most of the time. Last visit had sinusitis when blood pressure was high.   BP Readings from Last 3 Encounters:  03/20/23 132/80  11/15/22 (!) 150/77  05/12/22 106/70  A/P: reasonably well controlled and was better when not ill 2 visits ago. He is also losing weight we opted to hold off on medication(s) changes for now- cardiology on other hand may want [place] to be more aggressive depending on their  perspective   #hyperlipidemia- CT calcium 08/22/18 with score of 587 at 90% S: Medication: Atorvastatin 10 mg Lab Results  Component Value Date   CHOL 131 04/15/2021   HDL 41 04/15/2021   LDLCALC 70 04/15/2021   TRIG 109 04/15/2021   A/P: we looked back today and saw an old CT calcium score at 90% and I offered a baseline cardiology evaluation just to be on cautious side- he does not have any chest pain- perhaps some shortness of breath with stairs though- he is open to this nad referral made today  # Sinus irritation S:saline solution helps some. Seen by DrJon Billings in december - placed on loratadine and Flonase - some stopped up but not much sinus. Not blowing out much discharge.  A/P: could certainly be allergies- feels under reasonable control- wants to  continue current medications   #foamy urine- been going on a few months- also passed a stone reently- we will check urinalysis and microalbumin/ratio test  #Focus issues- reports having issues since childhood. Wants to hold off on further evaluation for now and focus on blood pressure issues and may be open to evaluation in future.   Recommended follow up: Return in about 6 months (around 09/19/2023) for followup or sooner if needed.Schedule b4 you leave.  Lab/Order associations: fasting    ICD-10-CM   1. Preventative health care  Z00.00     2. Hyperlipidemia, unspecified hyperlipidemia type  E78.5 CBC with Differential/Platelet    Comprehensive metabolic panel    Lipid panel    3. Foamy urine  R82.998 Urinalysis, Routine w reflex microscopic    Microalbumin / creatinine urine ratio    4. Screening for prostate cancer  Z12.5 PSA, Medicare ( [place] [facility] only)    5. Former smoker  Z87.891 [place] AORTA MEDICARE SCREENING    6. Coronary artery disease involving native coronary artery of native heart without angina pectoris  I25.10 Ambulatory referral to Cardiology    7. Skin lesion  L98.9 Ambulatory referral to Dermatology       No orders of the defined types were placed in this encounter.   Return precautions advised.  Tana Conch, MD

## 2023-03-20 NOTE — Patient Instructions (Addendum)
Prevnar 20 today  We will call you within two weeks about your referral for aortic aneursym screening of the abdomen through [facility].  Their phone number is [phone].  Please call them if you have not heard in 1-2 weeks  we looked back today and saw an old CT calcium score at 90% and I offered a baseline cardiology evaluation just to be on cautious side- he does not have any chest pain- perhaps some shortness of breath with stairs though- he is open to this nad referral made today  We have placed a referral for you today to cardiology and dermatology. In some cases you will see # listed below- you can call this if you have not heard within a week. If you do not see # listed- you should receive a mychart message or phone call within a week with the # to call.   Please go to [place] [facility]  - located [address] across the street from [place] - in the basement - Hours: 7:30-5:30 PM M-F. You do NOT need an appointment.    Recommended follow up: Return in about 6 months (around 09/19/2023) for followup or sooner if needed.Schedule b4 you leave.

## 2023-03-22 ENCOUNTER — Other Ambulatory Visit: Payer: Self-pay

## 2023-03-22 DIAGNOSIS — R972 Elevated prostate specific antigen [PSA]: Secondary | ICD-10-CM

## 2023-03-22 DIAGNOSIS — D72829 Elevated white blood cell count, unspecified: Secondary | ICD-10-CM

## 2023-03-22 MED ORDER — ATORVASTATIN CALCIUM 20 MG PO TABS: 20.0000 mg | ORAL_TABLET | Freq: Every day | ORAL | 3 refills | Status: DC

## 2023-03-30 ENCOUNTER — Encounter: Payer: Self-pay | Admitting: Family Medicine

## 2023-03-31 ENCOUNTER — Other Ambulatory Visit: Payer: 59

## 2023-03-31 ENCOUNTER — Other Ambulatory Visit: Payer: Self-pay

## 2023-03-31 DIAGNOSIS — R300 Dysuria: Secondary | ICD-10-CM

## 2023-04-01 LAB — URINE CULTURE
MICRO NUMBER:: 14910751
Result:: NO GROWTH
SPECIMEN QUALITY:: ADEQUATE

## 2023-04-11 ENCOUNTER — Telehealth: Payer: Self-pay | Admitting: Family Medicine

## 2023-04-11 MED ORDER — ATORVASTATIN CALCIUM 20 MG PO TABS: 20.0000 mg | ORAL_TABLET | Freq: Every day | ORAL | 3 refills | Status: DC

## 2023-04-11 NOTE — Telephone Encounter (Signed)
The following RX sent 03/22/23 was not received by the Pharmacy  Prescription Request  04/11/2023  LOV: 03/20/2023  What is the name of the medication or equipment? atorvastatin (LIPITOR) 20 MG tablet   Have you contacted your pharmacy to request a refill? Yes   Which pharmacy would you like this sent to?  [id number] - [place], [place] - [address] AT CORNER OF [facility] [address] [place] [address] Phone: [phone] Fax: [phone]    Patient notified that their request is being sent to the clinical staff for review and that they should receive a response within 2 business days.   Please advise at Mobile [phone] (mobile)

## 2023-04-11 NOTE — Telephone Encounter (Signed)
Rx resent.

## 2023-04-17 ENCOUNTER — Other Ambulatory Visit: Payer: 59

## 2023-04-17 ENCOUNTER — Other Ambulatory Visit (INDEPENDENT_AMBULATORY_CARE_PROVIDER_SITE_OTHER): Payer: 59

## 2023-04-17 DIAGNOSIS — D72829 Elevated white blood cell count, unspecified: Secondary | ICD-10-CM

## 2023-04-17 DIAGNOSIS — R972 Elevated prostate specific antigen [PSA]: Secondary | ICD-10-CM | POA: Diagnosis not present

## 2023-04-17 LAB — CBC WITH DIFFERENTIAL/PLATELET
Basophils Absolute: 0.1 10*3/uL (ref 0.0–0.1)
Basophils Relative: 0.6 % (ref 0.0–3.0)
Eosinophils Absolute: 0.3 10*3/uL (ref 0.0–0.7)
Eosinophils Relative: 2.9 % (ref 0.0–5.0)
HCT: 44.3 % (ref 39.0–52.0)
Hemoglobin: 15.2 g/dL (ref 13.0–17.0)
Lymphocytes Relative: 23.9 % (ref 12.0–46.0)
Lymphs Abs: 2.8 10*3/uL (ref 0.7–4.0)
MCHC: 34.4 g/dL (ref 30.0–36.0)
MCV: 89.5 fl (ref 78.0–100.0)
Monocytes Absolute: 0.8 10*3/uL (ref 0.1–1.0)
Monocytes Relative: 6.7 % (ref 3.0–12.0)
Neutro Abs: 7.8 10*3/uL — ABNORMAL HIGH (ref 1.4–7.7)
Neutrophils Relative %: 65.9 % (ref 43.0–77.0)
Platelets: 215 10*3/uL (ref 150.0–400.0)
RBC: 4.95 Mil/uL (ref 4.22–5.81)
RDW: 13.1 % (ref 11.5–15.5)
WBC: 11.8 10*3/uL — ABNORMAL HIGH (ref 4.0–10.5)

## 2023-04-18 LAB — PSA: PSA: 1.69 ng/mL (ref 0.10–4.00)

## 2023-04-24 NOTE — Progress Notes (Deleted)
Cardiology Office Note:    Date:  04/24/2023   ID:  Travis Burgess, DOB May 17, 1956, MRN [id number]  PCP:  Shelva Majestic, MD  Cardiologist:  None  Electrophysiologist:  None   Referring MD: Shelva Majestic, MD   No chief complaint on file. ***  History of Present Illness:    Travis Burgess is a 67 y.o. male with a hx of CAD, hypertension, hyperlipidemia, pneumothorax status post chest tube at age 64, nephrolithiasis he was referred by Dr. Durene Cal for evaluation of CAD.  Calcium score 08/22/2018 was 587 (90th percentile), also noted to have multiple small pulmonary nodules measuring 5 mm or less.  Past Medical History:  Diagnosis Date   Chronic kidney disease    kidney stones   GERD (gastroesophageal reflux disease)    Hyperlipidemia    Hypertension    Pneumothorax    chest tube around age 74    Past Surgical History:  Procedure Laterality Date   BACK SURGERY     herniated disc around age 30   CYSTOSCOPY W/ URETERAL STENT PLACEMENT  12/13/2011   kidney stone. Procedure: CYSTOSCOPY WITH RETROGRADE PYELOGRAM/URETERAL STENT PLACEMENT;  Surgeon: Kathi Ludwig, MD;  Location: [facility];  Service: Urology;  Laterality: Left;   NOSE SURGERY  35 years ago   broken nose playing soccer    Current Medications: No outpatient medications have been marked as taking for the 04/26/23 encounter (Appointment) with Little Ishikawa, MD.     Allergies:   Patient has no known allergies.   Social History   Socioeconomic History   Marital status: Married    Spouse name: Not on file   Number of children: Not on file   Years of education: Not on file   Highest education level: Not on file  Occupational History   Not on file  Tobacco Use   Smoking status: Former    Packs/day: 0.50    Years: 6.00    Additional pack years: 0.00    Total pack years: 3.00    Types: Cigarettes    Quit date: 12/12/1980    Years since quitting: 42.3   Smokeless tobacco: Never  Vaping Use    Vaping Use: Never used  Substance and Sexual Activity   Alcohol use: Yes    Comment: maybe one a month or so   Drug use: No   Sexual activity: Yes  Other Topics Concern   Not on file  Social History Narrative   Married. 3 kids. 7 grandkids.       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      Hobbies: enjoys his work, IT sales professional, Presenter, broadcasting, has enjoyed walking in past, Technical brewer, time with family/grandkids   Social Determinants of Corporate investment banker Strain: Not on file  Food Insecurity: Not on file  Transportation Needs: Not on file  Physical Activity: Not on file  Stress: Not on file  Social Connections: Not on file     Family History: The patient's ***family history includes CAD in his father and maternal grandfather; Dementia in his mother; Healthy in his sister; Heart disease in his paternal grandfather; Hyperlipidemia in his brother and maternal grandfather; Hypothyroidism in his brother. There is no history of Colon polyps, Colon cancer, Esophageal cancer, Stomach cancer, or Rectal cancer.  ROS:   Please see the history of present illness.    *** All other systems reviewed and are negative.  EKGs/Labs/Other Studies Reviewed:    The following studies were reviewed today: ***  EKG:  EKG is *** ordered today.  The ekg ordered today demonstrates ***  Recent Labs: 03/20/2023: ALT 56; BUN 16; Creatinine, Ser 1.16; Potassium 4.2; Sodium 134 04/17/2023: Hemoglobin 15.2; Platelets 215.0  Recent Lipid Panel    Component Value Date/Time   CHOL 155 03/20/2023 1544   TRIG 138.0 03/20/2023 1544   HDL 41.30 03/20/2023 1544   CHOLHDL 4 03/20/2023 1544   VLDL 27.6 03/20/2023 1544   LDLCALC 86 03/20/2023 1544    Physical Exam:    VS:  There were no vitals taken for this visit.    Wt Readings from Last 3 Encounters:  03/20/23 250 lb 3.2 oz (113.5 kg)  11/15/22 256 lb 3.2 oz (116.2 kg)  05/12/22  261 lb (118.4 kg)     GEN: *** Well nourished, well developed in no acute distress HEENT: Normal NECK: No JVD; No carotid bruits LYMPHATICS: No lymphadenopathy CARDIAC: ***RRR, no murmurs, rubs, gallops RESPIRATORY:  Clear to auscultation without rales, wheezing or rhonchi  ABDOMEN: Soft, non-tender, non-distended MUSCULOSKELETAL:  No edema; No deformity  SKIN: Warm and dry NEUROLOGIC:  Alert and oriented x 3 PSYCHIATRIC:  Normal affect   ASSESSMENT:    No diagnosis found. PLAN:    CAD: Calcium score 08/22/2018 was 587 (90th percentile) -Continue atorvastatin 20 mg daily  Hypertension: On lisinopril-HCTZ 20-12.5 mg daily  Hyperlipidemia: On atorvastatin 20 mg daily  Pulmonary nodules: Multiple nodules measuring 5 mm or less on CT in 2019.***  RTC in***  Medication Adjustments/Labs and Tests Ordered: Current medicines are reviewed at length with the patient today.  Concerns regarding medicines are outlined above.  No orders of the defined types were placed in this encounter.  No orders of the defined types were placed in this encounter.   There are no Patient Instructions on file for this visit.   Signed, Little Ishikawa, MD  04/24/2023 8:52 PM     [facility]

## 2023-04-25 ENCOUNTER — Ambulatory Visit
Admission: RE | Admit: 2023-04-25 | Discharge: 2023-04-25 | Disposition: A | Discharge status: Home / Self Care | Payer: 59 | Source: Ambulatory Visit | Attending: Family Medicine | Admitting: Family Medicine

## 2023-04-25 DIAGNOSIS — Z87891 Personal history of nicotine dependence: Secondary | ICD-10-CM

## 2023-04-26 ENCOUNTER — Ambulatory Visit: Payer: 59 | Admitting: Cardiology

## 2023-05-05 ENCOUNTER — Ambulatory Visit: Payer: 59 | Admitting: Cardiovascular Disease

## 2023-05-22 ENCOUNTER — Ambulatory Visit: Payer: 59 | Admitting: Orthopedic Surgery

## 2023-05-22 ENCOUNTER — Other Ambulatory Visit (INDEPENDENT_AMBULATORY_CARE_PROVIDER_SITE_OTHER): Payer: 59

## 2023-05-22 ENCOUNTER — Encounter: Payer: Self-pay | Admitting: Orthopedic Surgery

## 2023-05-22 DIAGNOSIS — M25522 Pain in left elbow: Secondary | ICD-10-CM | POA: Diagnosis not present

## 2023-05-22 NOTE — Progress Notes (Signed)
Office Visit Note   Patient: Travis Burgess           Date of Birth: 03/08/1956           MRN: [id number] Visit Date: 05/22/2023 Requested by: Shelva Majestic, MD [address] [place],  [place] [address] PCP: Shelva Majestic, MD  Subjective: Chief Complaint  Patient presents with   Left Elbow - Pain    HPI: Travis Burgess is a 67 y.o. male who presents to the office reporting left elbow pain.  Been going on for about 4 months but worse over the past several weeks.  Started after he was having to lift a 100 pound dog several days a week to take to the vet for treatment.  He is right-hand dominant.  He noticed exacerbation of symptoms over the past several weeks when he was trying to play golf.  He actually played some yesterday and had much less symptoms.  He has been using a TENS unit type device on the anterior lateral aspect of the elbow within the elbow flexion crease.  Does not really report pain with extension.  Overall is feeling better.  He has been using the unit for about 3 days and will continue to use it for another week.  No problems sleeping.  No radiation into the hand.  Does not really localize symptoms to either the medial or lateral epicondyle but more in the area of the proximal mobile wad area..                ROS: All systems reviewed are negative as they relate to the chief complaint within the history of present illness.  Patient denies fevers or chills.  Assessment & Plan: Visit Diagnoses:  1. Pain in left elbow     Plan: Impression is left elbow pain which looks to be more like muscle strain either in the proximal mobile wad or brachialis.  Biceps tendon itself is nontender.  We looked at the area with ultrasound as well and could not really detect any significant disruption of muscular architecture.  Plan at this time is for observation versus MRI scan to get a better look at the soft tissues.  Elbow itself has full range of motion with no real signs or symptoms  indicating either medial or lateral common flexor/common extensor tendinosis.  He will call if he wants to get set up for the MRI scan on the elbow.  I think he should continue with the unit which is delivering modality type therapy to the area in question. Follow-Up Instructions: No follow-ups on file.   Orders:  Orders Placed This Encounter  Procedures   XR Elbow 2 Views Left   No orders of the defined types were placed in this encounter.     Procedures: No procedures performed   Clinical Data: No additional findings.  Objective: Vital Signs: There were no vitals taken for this visit.  Physical Exam:  Constitutional: Patient appears well-developed HEENT:  Head: Normocephalic Eyes:EOM are normal Neck: Normal range of motion Cardiovascular: Normal rate Pulmonary/chest: Effort normal Neurologic: Patient is alert Skin: Skin is warm Psychiatric: Patient has normal mood and affect  Ortho Exam: Ortho exam demonstrates full active and passive range of motion of the left elbow.  No pain with resisted pronation or supination.  No real tenderness in the biceps tendon.  No tenderness of the left lateral epicondyle with resisted wrist extension.  No subluxation of the ulnar nerve.  No tenderness with  resisted wrist flexion around the medial epicondyle.  Triceps tendon nontender.  Specialty Comments:  No specialty comments available.  Imaging: XR Elbow 2 Views Left  Result Date: 05/22/2023 AP lateral radiographs left elbow reviewed.  No acute fracture.  No significant arthritis in the ulnohumeral joint.  Minimal enthesopathic changes at the medial and lateral epicondyle of the elbow.    PMFS History: Patient Active Problem List   Diagnosis Date Noted   CAD (coronary artery disease) 03/20/2023   Hyperlipidemia 02/08/2022   Essential hypertension 02/08/2022   Past Medical History:  Diagnosis Date   Chronic kidney disease    kidney stones   GERD (gastroesophageal reflux  disease)    Hyperlipidemia    Hypertension    Pneumothorax    chest tube around age 30    Family History  Problem Relation Age of Onset   Dementia Mother        started in 69s   CAD Father        bypass at 72 or 26. cigars. died 52-87   Healthy Sister    Hyperlipidemia Brother    Hypothyroidism Brother    Hyperlipidemia Maternal Grandfather    CAD Maternal Grandfather    Heart disease Paternal Grandfather        died rather young- 60s   Colon polyps Neg Hx    Colon cancer Neg Hx    Esophageal cancer Neg Hx    Stomach cancer Neg Hx    Rectal cancer Neg Hx     Past Surgical History:  Procedure Laterality Date   BACK SURGERY     herniated disc around age 56   CYSTOSCOPY W/ URETERAL STENT PLACEMENT  12/13/2011   kidney stone. Procedure: CYSTOSCOPY WITH RETROGRADE PYELOGRAM/URETERAL STENT PLACEMENT;  Surgeon: Kathi Ludwig, MD;  Location: [facility];  Service: Urology;  Laterality: Left;   NOSE SURGERY  35 years ago   broken nose playing soccer   Social History   Occupational History   Not on file  Tobacco Use   Smoking status: Former    Packs/day: 0.50    Years: 6.00    Additional pack years: 0.00    Total pack years: 3.00    Types: Cigarettes    Quit date: 12/12/1980    Years since quitting: 42.4   Smokeless tobacco: Never  Vaping Use   Vaping Use: Never used  Substance and Sexual Activity   Alcohol use: Yes    Comment: maybe one a month or so   Drug use: No   Sexual activity: Yes

## 2023-05-28 ENCOUNTER — Other Ambulatory Visit: Payer: Self-pay | Admitting: Family Medicine

## 2023-06-12 NOTE — Progress Notes (Signed)
Cardiology Office Note:    Date:  06/15/2023   ID:  Travis Burgess, DOB 10-Apr-1956, MRN [id number]  PCP:  Shelva Majestic, MD  Cardiologist:  Little Ishikawa, MD  Electrophysiologist:  None   Referring MD: Shelva Majestic, MD   Chief Complaint  Patient presents with   Coronary Artery Disease    History of Present Illness:    Travis Burgess is a 67 y.o. male with a hx of hypertension, hyperlipidemia, pneumothorax, nephrolithiasis who is referred by Dr. Durene Cal for evaluation of CAD. Calcium score 07/2018 was 587 (90th percentile).  He denies any chest pain but reports is having dyspnea with walking up stairs.  He walks his dog for exercise, typically does 1.5 to 2 miles about 5 days/week.  Reports occasional lightheadedness but denies any syncope.  Denies any lower extremity edema or palpitations.  He smoked for 70 years about 0.5 packs/day, quit in late 20s.  Family history includes father had CABG in 71s.     Past Medical History:  Diagnosis Date   Chronic kidney disease    kidney stones   GERD (gastroesophageal reflux disease)    Hyperlipidemia    Hypertension    Pneumothorax    chest tube around age 53    Past Surgical History:  Procedure Laterality Date   BACK SURGERY     herniated disc around age 24   CYSTOSCOPY W/ URETERAL STENT PLACEMENT  12/13/2011   kidney stone. Procedure: CYSTOSCOPY WITH RETROGRADE PYELOGRAM/URETERAL STENT PLACEMENT;  Surgeon: Kathi Ludwig, MD;  Location: [facility];  Service: Urology;  Laterality: Left;   NOSE SURGERY  35 years ago   broken nose playing soccer    Current Medications: Current Meds  Medication Sig   aspirin EC 81 MG tablet Take 1 tablet (81 mg total) by mouth daily. Swallow whole.   atorvastatin (LIPITOR) 20 MG tablet Take 1 tablet (20 mg total) by mouth daily.   fluticasone (FLONASE) 50 MCG/ACT nasal spray Place 2 sprays into both nostrils daily.   lisinopril-hydrochlorothiazide (ZESTORETIC) 20-12.5 MG tablet TAKE  1 TABLET BY MOUTH EVERY DAY     Allergies:   Patient has no known allergies.   Social History   Socioeconomic History   Marital status: Married    Spouse name: Not on file   Number of children: Not on file   Years of education: Not on file   Highest education level: Not on file  Occupational History   Not on file  Tobacco Use   Smoking status: Former    Current packs/day: 0.00    Average packs/day: 0.5 packs/day for 6.0 years (3.0 ttl pk-yrs)    Types: Cigarettes    Start date: 12/12/1974    Quit date: 12/12/1980    Years since quitting: 42.5   Smokeless tobacco: Never  Vaping Use   Vaping status: Never Used  Substance and Sexual Activity   Alcohol use: Yes    Comment: maybe one a month or so   Drug use: No   Sexual activity: Yes  Other Topics Concern   Not on file  Social History Narrative   Married. 3 kids. 7 grandkids.       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      Hobbies: enjoys his work, IT sales professional, Presenter, broadcasting, has enjoyed walking in past, beach, time with family/grandkids   Social Determinants of Corporate investment banker Strain: Not on  file  Food Insecurity: Not on file  Transportation Needs: Not on file  Physical Activity: Not on file  Stress: Not on file  Social Connections: Not on file     Family History: The patient's family history includes CAD in his father and maternal grandfather; Dementia in his mother; Healthy in his sister; Heart disease in his paternal grandfather; Hyperlipidemia in his brother and maternal grandfather; Hypothyroidism in his brother. There is no history of Colon polyps, Colon cancer, Esophageal cancer, Stomach cancer, or Rectal cancer.  ROS:   Please see the history of present illness.     All other systems reviewed and are negative.  EKGs/Labs/Other Studies Reviewed:    The following studies were reviewed today:   EKG:   06/15/23: Normal  sinus rhythm, rate 85, no ST abnormalities  Recent Labs: 03/20/2023: ALT 56; BUN 16; Creatinine, Ser 1.16; Potassium 4.2; Sodium 134 04/17/2023: Hemoglobin 15.2; Platelets 215.0  Recent Lipid Panel    Component Value Date/Time   CHOL 155 03/20/2023 1544   TRIG 138.0 03/20/2023 1544   HDL 41.30 03/20/2023 1544   CHOLHDL 4 03/20/2023 1544   VLDL 27.6 03/20/2023 1544   LDLCALC 86 03/20/2023 1544    Physical Exam:    VS:  BP 134/80 (BP Location: Left Arm, Patient Position: Sitting, Cuff Size: Large)   Pulse 85   Ht 5\' 11"  (1.803 m)   Wt 251 lb 3.2 oz (113.9 kg)   SpO2 94%   BMI 35.04 kg/m     Wt Readings from Last 3 Encounters:  06/15/23 251 lb 3.2 oz (113.9 kg)  03/20/23 250 lb 3.2 oz (113.5 kg)  11/15/22 256 lb 3.2 oz (116.2 kg)     GEN:  Well nourished, well developed in no acute distress HEENT: Normal NECK: No JVD; No carotid bruits LYMPHATICS: No lymphadenopathy CARDIAC: RRR, no murmurs, rubs, gallops RESPIRATORY:  Clear to auscultation without rales, wheezing or rhonchi  ABDOMEN: Soft, non-tender, non-distended MUSCULOSKELETAL:  No edema; No deformity  SKIN: Warm and dry NEUROLOGIC:  Alert and oriented x 3 PSYCHIATRIC:  Normal affect   ASSESSMENT:    1. DOE (dyspnea on exertion)   2. Coronary artery disease involving native coronary artery of native heart, unspecified whether angina present   3. Essential hypertension   4. Hyperlipidemia, unspecified hyperlipidemia type    PLAN:    CAD: Calcium score 07/2018 was 587 (90th percentile).  Reporting dyspnea on exertion that may represent anginal equivalent -Start aspirin 81 mg daily -Continue atorvastatin 20 mg daily.  Check lipid panel -Recommend stress PET to evaluate for ischemia -Check echocardiogram  Hypertension: On lisinopril-HCTZ 20-12.5 mg daily.  Appears controlled  Hyperlipidemia: LDL 86 on 03/20/2023, atorvastatin was increased to 20 mg daily at that time.  Recheck lipid panel  RTC in 4  months  Informed Consent   Shared Decision Making/Informed Consent The risks [chest pain, shortness of breath, cardiac arrhythmias, dizziness, blood pressure fluctuations, myocardial infarction, stroke/transient ischemic attack, nausea, vomiting, allergic reaction, radiation exposure, metallic taste sensation and life-threatening complications (estimated to be 1 in 10,000)], benefits (risk stratification, diagnosing coronary artery disease, treatment guidance) and alternatives of a cardiac PET stress test were discussed in detail with Travis Burgess and he agrees to proceed.       Medication Adjustments/Labs and Tests Ordered: Current medicines are reviewed at length with the patient today.  Concerns regarding medicines are outlined above.  Orders Placed This Encounter  Procedures   NM PET CT CARDIAC PERFUSION MULTI W/ABSOLUTE  BLOODFLOW   Basic metabolic panel   Lipid panel   Magnesium   EKG 12-Lead   ECHOCARDIOGRAM COMPLETE   Meds ordered this encounter  Medications   aspirin EC 81 MG tablet    Sig: Take 1 tablet (81 mg total) by mouth daily. Swallow whole.    Patient Instructions  Medication Instructions:   Start taking Aspirin 81 mg ( enteric) daily - over the counter  *If you need a refill on your cardiac medications before your next appointment, please call your pharmacy*   Lab Work: Fasting   Lipid BMP Magnesium  If you have labs (blood work) drawn today and your tests are completely normal, you will receive your results only by: MyChart Message (if you have MyChart) OR A paper copy in the mail If you have any lab test that is abnormal or we need to change your treatment, we will call you to review the results.   Testing/Procedures:  Will be schedule at [facility] [address] Your physician has requested that you have an echocardiogram. Echocardiography is a painless test that uses sound waves to create images of your heart. It provides your doctor with  information about the size and shape of your heart and how well your heart's chambers and valves are working. This procedure takes approximately one hour. There are no restrictions for this procedure. Please do NOT wear cologne, perfume, aftershave, or lotions (deodorant is allowed). Please arrive 15 minutes prior to your appointment time.   And  Will be  schedule at [facility] - scheduling will contact you with appt day and time Recommend you having a PET SCAN at [facility]  .A cardiac PET scan is an accurate, noninvasive test that creates images of your heart. A healthcare provider can use these images to make decisions about the next steps in your heart care. They can judge how healthy your heart muscle is and decide how to treat it. A cardiac PET scan uses a small amount of radiation   Follow-Up: At [facility], you and your health needs are our priority.  As part of our continuing mission to provide you with exceptional heart care, we have created designated Provider Care Teams.  These Care Teams include your primary Cardiologist (physician) and Advanced Practice Providers (APPs -  Physician Assistants and Nurse Practitioners) who all work together to provide you with the care you need, when you need it.     Your next appointment:   4 month(s)  The format for your next appointment:   In Person  Provider:   Little Ishikawa, MD    Other Instructions   How to Prepare for Your Cardiac PET/CT Stress Test:  1. Please do not take these medications before your test:   Medications that may interfere with the cardiac pharmacological stress agent (ex. nitrates - including erectile dysfunction medications, isosorbide mononitrate, tamulosin or beta-blockers) the day of the exam. (Erectile dysfunction medication should be held for at least 72 hrs prior to test) Theophylline containing medications for 12 hours. Dipyridamole 48 hours prior to the test. Your  remaining medications may be taken with water.  2. Nothing to eat or drink, except water, 3 hours prior to arrival time.   NO caffeine/decaffeinated products, or chocolate 12 hours prior to arrival.  3. NO perfume, cologne or lotion  4. Total time is 1 to 2 hours; you may want to bring reading material for the waiting time.  5.  Please report to Radiology at the [facility] Main Entrance 30 minutes early for your test.  [address] [place], [place] [address]      IF YOU THINK YOU MAY BE PREGNANT, OR ARE NURSING PLEASE INFORM THE TECHNOLOGIST.  In preparation for your appointment, medication and supplies will be purchased.  Appointment availability is limited, so if you need to cancel or reschedule, please call the Radiology Department at [phone] [facility]) OR [phone] [facility])  24 hours in advance to avoid a cancellation fee of $100.00  What to Expect After you Arrive:  Once you arrive and check in for your appointment, you will be taken to a preparation room within the Radiology Department.  A technologist or Nurse will obtain your medical history, verify that you are correctly prepped for the exam, and explain the procedure.  Afterwards,  an IV will be started in your arm and electrodes will be placed on your skin for EKG monitoring during the stress portion of the exam. Then you will be escorted to the PET/CT scanner.  There, staff will get you positioned on the scanner and obtain a blood pressure and EKG.  During the exam, you will continue to be connected to the EKG and blood pressure machines.  A small, safe amount of a radioactive tracer will be injected in your IV to obtain a series of pictures of your heart along with an injection of a stress agent.    After your Exam:  It is recommended that you eat a meal and drink a caffeinated beverage to counter act any effects of the stress agent.  Drink plenty of fluids for the remainder of the day and urinate  frequently for the first couple of hours after the exam.  Your doctor will inform you of your test results within 7-10 business days.  For more information and frequently asked questions, please visit our website : [email or web address]  For questions about your test or how to prepare for your test, please call: Cardiac Imaging Nurse Navigators Office: [phone]    Signed, Little Ishikawa, MD  06/15/2023 5:09 PM    [facility]

## 2023-06-15 ENCOUNTER — Ambulatory Visit: Payer: 59 | Attending: Cardiovascular Disease | Admitting: Cardiology

## 2023-06-15 ENCOUNTER — Ambulatory Visit: Payer: 59 | Admitting: Cardiology

## 2023-06-15 VITALS — BP 134/80 | HR 85 | Ht 71.0 in | Wt 251.2 lb

## 2023-06-15 DIAGNOSIS — I2510 Atherosclerotic heart disease of native coronary artery without angina pectoris: Secondary | ICD-10-CM

## 2023-06-15 DIAGNOSIS — R0609 Other forms of dyspnea: Secondary | ICD-10-CM

## 2023-06-15 DIAGNOSIS — E785 Hyperlipidemia, unspecified: Secondary | ICD-10-CM | POA: Diagnosis not present

## 2023-06-15 DIAGNOSIS — I10 Essential (primary) hypertension: Secondary | ICD-10-CM

## 2023-06-15 MED ORDER — ASPIRIN 81 MG PO TBEC: 81.0000 mg | DELAYED_RELEASE_TABLET | Freq: Every day | ORAL | Status: AC

## 2023-06-15 NOTE — Patient Instructions (Addendum)
Medication Instructions:   Start taking Aspirin 81 mg ( enteric) daily - over the counter  *If you need a refill on your cardiac medications before your next appointment, please call your pharmacy*   Lab Work: Fasting   Lipid BMP Magnesium  If you have labs (blood work) drawn today and your tests are completely normal, you will receive your results only by: MyChart Message (if you have MyChart) OR A paper copy in the mail If you have any lab test that is abnormal or we need to change your treatment, we will call you to review the results.   Testing/Procedures:  Will be schedule at [facility] [address] Your physician has requested that you have an echocardiogram. Echocardiography is a painless test that uses sound waves to create images of your heart. It provides your doctor with information about the size and shape of your heart and how well your heart's chambers and valves are working. This procedure takes approximately one hour. There are no restrictions for this procedure. Please do NOT wear cologne, perfume, aftershave, or lotions (deodorant is allowed). Please arrive 15 minutes prior to your appointment time.   And  Will be  schedule at [facility] - scheduling will contact you with appt day and time Recommend you having a PET SCAN at [facility]  .A cardiac PET scan is an accurate, noninvasive test that creates images of your heart. A healthcare provider can use these images to make decisions about the next steps in your heart care. They can judge how healthy your heart muscle is and decide how to treat it. A cardiac PET scan uses a small amount of radiation   Follow-Up: At [facility], you and your health needs are our priority.  As part of our continuing mission to provide you with exceptional heart care, we have created designated Provider Care Teams.  These Care Teams include your primary Cardiologist (physician) and Advanced Practice  Providers (APPs -  Physician Assistants and Nurse Practitioners) who all work together to provide you with the care you need, when you need it.     Your next appointment:   4 month(s)  The format for your next appointment:   In Person  Provider:   Little Ishikawa, MD    Other Instructions   How to Prepare for Your Cardiac PET/CT Stress Test:  1. Please do not take these medications before your test:   Medications that may interfere with the cardiac pharmacological stress agent (ex. nitrates - including erectile dysfunction medications, isosorbide mononitrate, tamulosin or beta-blockers) the day of the exam. (Erectile dysfunction medication should be held for at least 72 hrs prior to test) Theophylline containing medications for 12 hours. Dipyridamole 48 hours prior to the test. Your remaining medications may be taken with water.  2. Nothing to eat or drink, except water, 3 hours prior to arrival time.   NO caffeine/decaffeinated products, or chocolate 12 hours prior to arrival.  3. NO perfume, cologne or lotion  4. Total time is 1 to 2 hours; you may want to bring reading material for the waiting time.  5. Please report to Radiology at the [facility] Main Entrance 30 minutes early for your test.  [address] [place], [place] [address]      IF YOU THINK YOU MAY BE PREGNANT, OR ARE NURSING PLEASE INFORM THE TECHNOLOGIST.  In preparation for your appointment, medication and supplies will be purchased.  Appointment availability is limited, so if you need to cancel or reschedule, please call the Radiology Department at [phone] [facility]) OR [phone] [facility])  24 hours in advance to avoid a cancellation fee of $100.00  What to Expect After you Arrive:  Once you arrive and check in for your appointment, you will be taken to a preparation room within the Radiology Department.  A technologist or Nurse will obtain your medical history, verify that  you are correctly prepped for the exam, and explain the procedure.  Afterwards,  an IV will be started in your arm and electrodes will be placed on your skin for EKG monitoring during the stress portion of the exam. Then you will be escorted to the PET/CT scanner.  There, staff will get you positioned on the scanner and obtain a blood pressure and EKG.  During the exam, you will continue to be connected to the EKG and blood pressure machines.  A small, safe amount of a radioactive tracer will be injected in your IV to obtain a series of pictures of your heart along with an injection of a stress agent.    After your Exam:  It is recommended that you eat a meal and drink a caffeinated beverage to counter act any effects of the stress agent.  Drink plenty of fluids for the remainder of the day and urinate frequently for the first couple of hours after the exam.  Your doctor will inform you of your test results within 7-10 business days.  For more information and frequently asked questions, please visit our website : [email or web address]  For questions about your test or how to prepare for your test, please call: Cardiac Imaging Nurse Navigators Office: [phone]

## 2023-06-17 LAB — LIPID PANEL
Chol/HDL Ratio: 3 ratio (ref 0.0–5.0)
Cholesterol, Total: 156 mg/dL (ref 100–199)
HDL: 52 mg/dL (ref >39)
LDL Chol Calc (NIH): 84 mg/dL (ref 0–99)
Triglycerides: 109 mg/dL (ref 0–149)
VLDL Cholesterol Cal: 20 mg/dL (ref 5–40)

## 2023-06-17 LAB — BASIC METABOLIC PANEL
BUN/Creatinine Ratio: 20 (ref 10–24)
BUN: 20 mg/dL (ref 8–27)
CO2: 22 mmol/L (ref 20–29)
Calcium: 9.2 mg/dL (ref 8.6–10.2)
Chloride: 101 mmol/L (ref 96–106)
Creatinine, Ser: 1 mg/dL (ref 0.76–1.27)
Glucose: 101 mg/dL — ABNORMAL HIGH (ref 70–99)
Potassium: 4.4 mmol/L (ref 3.5–5.2)
Sodium: 137 mmol/L (ref 134–144)
eGFR: 82 mL/min/{1.73_m2} (ref >59)

## 2023-06-17 LAB — MAGNESIUM: Magnesium: 1.9 mg/dL (ref 1.6–2.3)

## 2023-06-20 ENCOUNTER — Telehealth: Payer: Self-pay | Admitting: *Deleted

## 2023-06-20 DIAGNOSIS — E785 Hyperlipidemia, unspecified: Secondary | ICD-10-CM

## 2023-06-20 MED ORDER — ATORVASTATIN CALCIUM 40 MG PO TABS
40.0000 mg | ORAL_TABLET | Freq: Every day | ORAL | 3 refills | Status: DC
Start: 2023-06-20 — End: 2024-07-08

## 2023-06-20 NOTE — Telephone Encounter (Signed)
-----   Message from Little Ishikawa sent at 06/19/2023  6:04 AM EDT ----- Labs look good but cholesterol remains above goal LDL less than 70.  Recommend increase atorvastatin to 40 mg daily and check fasting lipid panel in 2 months

## 2023-06-20 NOTE — Telephone Encounter (Signed)
Pt has reviewed results via my chart  New script sent to the pharmacy  Lab orders mailed to the pt  

## 2023-07-11 ENCOUNTER — Ambulatory Visit (HOSPITAL_COMMUNITY): Payer: 59 | Attending: Cardiology

## 2023-07-11 DIAGNOSIS — I2510 Atherosclerotic heart disease of native coronary artery without angina pectoris: Secondary | ICD-10-CM

## 2023-07-11 DIAGNOSIS — R0609 Other forms of dyspnea: Secondary | ICD-10-CM

## 2023-07-11 LAB — ECHOCARDIOGRAM COMPLETE
Area-P 1/2: 2.96 cm2
S' Lateral: 2.5 cm

## 2023-07-11 MED ORDER — PERFLUTREN LIPID MICROSPHERE
1.0000 mL | INTRAVENOUS | Status: AC | PRN
Start: 2023-07-11 — End: 2023-07-11
  Administered 2023-07-11: 2 mL via INTRAVENOUS

## 2023-08-04 ENCOUNTER — Encounter (HOSPITAL_COMMUNITY): Payer: Self-pay

## 2023-08-07 ENCOUNTER — Telehealth (HOSPITAL_COMMUNITY): Payer: Self-pay | Admitting: Emergency Medicine

## 2023-08-07 ENCOUNTER — Telehealth (HOSPITAL_COMMUNITY): Payer: Self-pay | Admitting: *Deleted

## 2023-08-07 NOTE — Telephone Encounter (Signed)
Reaching out to patient to offer assistance regarding upcoming cardiac imaging study; pt verbalizes understanding of appt date/time, parking situation and where to check in, pre-test NPO status and medications ordered, and verified current allergies; name and call back number provided for further questions should they arise Sara Wallace RN Navigator Cardiac Imaging [facility] [phone] office [phone] cell 

## 2023-08-07 NOTE — Telephone Encounter (Signed)
Attempted to call patient regarding upcoming cardiac PET appointment. Left message on voicemail with name and callback number  Larey Brick RN Navigator Cardiac Imaging [facility] [phone] Office [phone] Cell  Reminder to hold caffeine 12 hours prior to his cardiac PET scan.

## 2023-08-08 ENCOUNTER — Ambulatory Visit (HOSPITAL_COMMUNITY)
Admission: RE | Admit: 2023-08-08 | Discharge: 2023-08-08 | Disposition: A | Discharge status: Home / Self Care | Payer: 59 | Source: Ambulatory Visit | Attending: Cardiology | Admitting: Cardiology

## 2023-08-08 DIAGNOSIS — I2510 Atherosclerotic heart disease of native coronary artery without angina pectoris: Secondary | ICD-10-CM | POA: Diagnosis present

## 2023-08-08 DIAGNOSIS — R0609 Other forms of dyspnea: Secondary | ICD-10-CM | POA: Diagnosis present

## 2023-08-08 LAB — NM PET CT CARDIAC PERFUSION MULTI W/ABSOLUTE BLOODFLOW
MBFR: 2.49
Nuc Rest EF: 52 %
Nuc Stress EF: 65 %
Rest MBF: 0.7 ml/g/min
Rest Nuclear Isotope Dose: 28.8 mCi
ST Depression (mm): 0 mm
Stress MBF: 1.74 ml/g/min
Stress Nuclear Isotope Dose: 28.4 mCi
TID: 0.87

## 2023-08-08 MED ORDER — REGADENOSON 0.4 MG/5ML IV SOLN
0.4000 mg | Freq: Once | INTRAVENOUS | Status: AC
  Administered 2023-08-08: 0.4 mg via INTRAVENOUS

## 2023-08-08 MED ORDER — REGADENOSON 0.4 MG/5ML IV SOLN
INTRAVENOUS | Status: AC
  Filled 2023-08-08: qty 5

## 2023-09-05 ENCOUNTER — Other Ambulatory Visit: Payer: Self-pay | Admitting: Family Medicine

## 2023-10-02 ENCOUNTER — Ambulatory Visit: Payer: 59 | Admitting: Family Medicine

## 2023-10-02 ENCOUNTER — Encounter: Payer: Self-pay | Admitting: Family Medicine

## 2023-10-02 VITALS — BP 124/70 | HR 78 | Temp 97.8°F | Ht 71.0 in | Wt 253.4 lb

## 2023-10-02 DIAGNOSIS — I10 Essential (primary) hypertension: Secondary | ICD-10-CM

## 2023-10-02 DIAGNOSIS — Z6835 Body mass index (BMI) 35.0-35.9, adult: Secondary | ICD-10-CM | POA: Diagnosis not present

## 2023-10-02 DIAGNOSIS — Z125 Encounter for screening for malignant neoplasm of prostate: Secondary | ICD-10-CM | POA: Diagnosis not present

## 2023-10-02 DIAGNOSIS — E785 Hyperlipidemia, unspecified: Secondary | ICD-10-CM | POA: Diagnosis not present

## 2023-10-02 DIAGNOSIS — I2510 Atherosclerotic heart disease of native coronary artery without angina pectoris: Secondary | ICD-10-CM | POA: Diagnosis not present

## 2023-10-02 DIAGNOSIS — Z131 Encounter for screening for diabetes mellitus: Secondary | ICD-10-CM | POA: Diagnosis not present

## 2023-10-02 DIAGNOSIS — E669 Obesity, unspecified: Secondary | ICD-10-CM | POA: Diagnosis not present

## 2023-10-02 LAB — CBC WITH DIFFERENTIAL/PLATELET
Basophils Absolute: 0.1 10*3/uL (ref 0.0–0.1)
Basophils Relative: 0.5 % (ref 0.0–3.0)
Eosinophils Absolute: 0.2 10*3/uL (ref 0.0–0.7)
Eosinophils Relative: 2 % (ref 0.0–5.0)
HCT: 46.1 % (ref 39.0–52.0)
Hemoglobin: 15.3 g/dL (ref 13.0–17.0)
Lymphocytes Relative: 20.5 % (ref 12.0–46.0)
Lymphs Abs: 2.4 10*3/uL (ref 0.7–4.0)
MCHC: 33.1 g/dL (ref 30.0–36.0)
MCV: 91.4 fL (ref 78.0–100.0)
Monocytes Absolute: 0.9 10*3/uL (ref 0.1–1.0)
Monocytes Relative: 7.3 % (ref 3.0–12.0)
Neutro Abs: 8.3 10*3/uL — ABNORMAL HIGH (ref 1.4–7.7)
Neutrophils Relative %: 69.7 % (ref 43.0–77.0)
Platelets: 204 10*3/uL (ref 150.0–400.0)
RBC: 5.05 Mil/uL (ref 4.22–5.81)
RDW: 12.9 % (ref 11.5–15.5)
WBC: 11.8 10*3/uL — ABNORMAL HIGH (ref 4.0–10.5)

## 2023-10-02 LAB — COMPREHENSIVE METABOLIC PANEL
ALT: 30 U/L (ref 0–53)
AST: 23 U/L (ref 0–37)
Albumin: 4.5 g/dL (ref 3.5–5.2)
Alkaline Phosphatase: 83 U/L (ref 39–117)
BUN: 19 mg/dL (ref 6–23)
CO2: 28 meq/L (ref 19–32)
Calcium: 9.7 mg/dL (ref 8.4–10.5)
Chloride: 99 meq/L (ref 96–112)
Creatinine, Ser: 1.07 mg/dL (ref 0.40–1.50)
GFR: 71.62 mL/min (ref >60.00)
Glucose, Bld: 79 mg/dL (ref 70–99)
Potassium: 4.1 meq/L (ref 3.5–5.1)
Sodium: 137 meq/L (ref 135–145)
Total Bilirubin: 1 mg/dL (ref 0.2–1.2)
Total Protein: 7.5 g/dL (ref 6.0–8.3)

## 2023-10-02 LAB — LIPID PANEL
Cholesterol: 152 mg/dL (ref 0–200)
HDL: 43.4 mg/dL (ref >39.00)
LDL Cholesterol: 82 mg/dL (ref 0–99)
NonHDL: 108.14
Total CHOL/HDL Ratio: 3
Triglycerides: 131 mg/dL (ref 0.0–149.0)
VLDL: 26.2 mg/dL (ref 0.0–40.0)

## 2023-10-02 LAB — PSA: PSA: 1.85 ng/mL (ref 0.10–4.00)

## 2023-10-02 LAB — HEMOGLOBIN A1C: Hgb A1c MFr Bld: 6 % (ref 4.6–6.5)

## 2023-10-02 MED ORDER — TRIAMCINOLONE ACETONIDE 0.1 % EX CREA: 1.0000 | TOPICAL_CREAM | Freq: Two times a day (BID) | CUTANEOUS | 0 refills | Status: AC

## 2023-10-02 NOTE — Progress Notes (Signed)
Phone [phone] In person visit   Subjective:   Travis Burgess is a 67 y.o. year old very pleasant male patient who presents for/with See problem oriented charting Chief Complaint  Patient presents with   Medical Management of Chronic Issues   Hyperlipidemia   right arm itching    Pt f/o right upper arm itcing    Past Medical History-  Patient Active Problem List   Diagnosis Date Noted   CAD (coronary artery disease) 03/20/2023    Priority: High   Hyperlipidemia 02/08/2022    Priority: Medium    Essential hypertension 02/08/2022    Priority: Medium     Medications- reviewed and updated Current Outpatient Medications  Medication Sig Dispense Refill   aspirin EC 81 MG tablet Take 1 tablet (81 mg total) by mouth daily. Swallow whole.     atorvastatin (LIPITOR) 40 MG tablet Take 1 tablet (40 mg total) by mouth daily. 90 tablet 3   fluticasone (FLONASE) 50 MCG/ACT nasal spray Place 2 sprays into both nostrils daily. 16 g 6   lisinopril-hydrochlorothiazide (ZESTORETIC) 20-12.5 MG tablet TAKE 1 TABLET BY MOUTH EVERY DAY 30 tablet 2   Saline (SIMPLY SALINE) 0.9 % AERS Place 2 each into the nose as directed. Use nightly for sinus hygiene long-term.  Can also be used as many times daily as desired to assist with clearing congested sinuses. 127 mL 11   No current facility-administered medications for this visit.     Objective:  BP 124/70   Pulse 78   Temp 97.8 F (36.6 C)   Ht 5\' 11"  (1.803 m)   Wt 253 lb 6.4 oz (114.9 kg)   SpO2 98%   BMI 35.34 kg/m  Gen: NAD, resting comfortably CV: RRR no murmurs rubs or gallops Lungs: CTAB no crackles, wheeze, rhonchi Ext: no edema Skin: warm, dry    Assessment and Plan   #hypertension S: medication: Lisinopril hydrochlorothiazide 20-12.5 mg - trying to reduce salt but enjoys it. Wants to work on weight loss as well, was able to come off meds in the past with weight loss. Has tried fiber one and helping with appetite.  BP  Readings from Last 3 Encounters:  10/02/23 124/70  08/08/23 (!) 103/52  06/15/23 134/80  A/P: stable- continue current medicines    #CAD #hyperlipidemia- CT calcium 08/22/18 with score of 587 at 90%-saw cardiology Dr. Bjorn Pippin in 2024 with low risk NM pet CT and echocardiogram.  S: Medication: Atorvastatin 40 mg, aspirin 81 mg -no chest pain. Stable shortness of breath could be deconditioning- walks dog 3-4 times a week. Has treadmill may trial.   A/P: CAD largely asymptomatic (suspect shortness of breath is deconditioning) has had excellent and reassuring evaluation with cardiology. Lipids hopefully improving with LDL goal now under 70- update lipid panel and CMP today  # Pulmonary nodules-noted on PET/CT 08/08/2023-with 1 year follow-up scan recommended if high risk.  Would consider intermediate risk with distant smoking history- he is agreeable to doing this again next year.   -Also fatty liver noted Lab Results  Component Value Date   ALT 56 (H) 03/20/2023   AST 32 03/20/2023   ALKPHOS 79 03/20/2023   BILITOT 0.9 03/20/2023   # Elevated PSA reading-at April visit elevation of PSA was noted-on repeat was trending back down and compared to 2 years prior within reasonable range of increase- we opted to get another level today  Lab Results  Component Value Date   PSA 1.69 04/17/2023   PSA 1.88  03/20/2023   PSA 1.25 04/15/2021   #Right arm itching in late afternoon and evening without skin changes. Does trim hairs on arms about every 2-3 months- on both upper arms on exam mildly red at hair follicles and dry but worse on the right upper arm - we recommended triamcinolone cream for 7-10 days twice a day and after applying this to put eucerin or Vaseline or cerave or similar on top. Follow up if not improving or worsens   Recommended follow up: Return in about 6 months (around 03/31/2024) for physical or sooner if needed.Schedule b4 you leave. Future Appointments  Date Time Provider  Department Center  10/18/2023 11:20 AM Little Ishikawa, MD [facility] None   Lab/Order associations: fasting other than coffee with cream and sugar   ICD-10-CM   1. Hyperlipidemia, unspecified hyperlipidemia type  E78.5 Comprehensive metabolic panel    CBC with Differential/Platelet    Lipid panel    2. Coronary artery disease involving native coronary artery of native heart without angina pectoris  I25.10     3. Essential hypertension  I10     4. Screening for prostate cancer  Z12.5 PSA    5. Screening for diabetes mellitus  Z13.1 HgB A1c    6. Obesity (BMI 30-39.9)  E66.9 HgB A1c      Meds ordered this encounter  Medications   triamcinolone cream (KENALOG) 0.1 %    Sig: Apply 1 Application topically 2 (two) times daily. For 7-10 days maximum    Dispense:  80 g    Refill:  0    Return precautions advised.  Tana Conch, MD

## 2023-10-02 NOTE — Patient Instructions (Addendum)
-   we recommended triamcinolone cream for 7-10 days twice a day and after applying this to put eucerin or Vaseline or cerave or similar on top. Follow up if not improving or worsens  Please stop by lab before you go If you have mychart- we will send your results within 3 business days of [place] receiving them.  If you do not have mychart- we will call you about results within 5 business days of [place] receiving them.  *please also note that you will see labs on mychart as soon as they post. I will later go in and write notes on them- will say "notes from Dr. Durene Cal"    Recommended follow up: Return in about 6 months (around 03/31/2024) for physical or sooner if needed.Schedule b4 you leave.

## 2023-10-15 NOTE — Progress Notes (Unsigned)
Cardiology Office Note:    Date:  10/18/2023   ID:  Travis Burgess, DOB 1956/08/24, MRN [id number]  PCP:  Shelva Majestic, MD  Cardiologist:  Little Ishikawa, MD  Electrophysiologist:  None   Referring MD: Shelva Majestic, MD   Chief Complaint  Patient presents with   Coronary Artery Disease    History of Present Illness:    Travis Burgess is a 67 y.o. male with a hx of hypertension, hyperlipidemia, pneumothorax, nephrolithiasis who presents for follow-up.  He was referred by Dr. Durene Cal for evaluation of CAD, initially seen 06/15/2023. Calcium score 07/2018 was 587 (90th percentile).  He denies any chest pain but reports is having dyspnea with walking up stairs.  He walks his dog for exercise, typically does 1.5 to 2 miles about 5 days/week.  Reports occasional lightheadedness but denies any syncope.  Denies any lower extremity edema or palpitations.  He smoked for 7 years about 0.5 packs/day, quit in late 20s.  Family history includes father had CABG in 64s.  Echocardiogram 07/11/2023 showed normal biventricular function, no significant valvular disease.  Stress PET 08/08/2023 showed normal perfusion, EF 52%, normal myocardial blood flow reserve.  Since last clinic visit, he reports he is doing okay.  Continues to have some dyspnea on exertion.  Denies any chest pain.  Denies any lightheadedness, syncope, lower extremity edema, or palpitations.  Walks dog for exercise.    Past Medical History:  Diagnosis Date   Chronic kidney disease    kidney stones   GERD (gastroesophageal reflux disease)    Hyperlipidemia    Hypertension    Pneumothorax    chest tube around age 46    Past Surgical History:  Procedure Laterality Date   BACK SURGERY     herniated disc around age 36   CYSTOSCOPY W/ URETERAL STENT PLACEMENT  12/13/2011   kidney stone. Procedure: CYSTOSCOPY WITH RETROGRADE PYELOGRAM/URETERAL STENT PLACEMENT;  Surgeon: Kathi Ludwig, MD;  Location: [facility];  Service:  Urology;  Laterality: Left;   NOSE SURGERY  35 years ago   broken nose playing soccer    Current Medications: Current Meds  Medication Sig   ezetimibe (ZETIA) 10 MG tablet Take 1 tablet (10 mg total) by mouth daily.     Allergies:   Patient has no known allergies.   Social History   Socioeconomic History   Marital status: Married    Spouse name: Not on file   Number of children: Not on file   Years of education: Not on file   Highest education level: Not on file  Occupational History   Not on file  Tobacco Use   Smoking status: Former    Current packs/day: 0.00    Average packs/day: 0.5 packs/day for 6.0 years (3.0 ttl pk-yrs)    Types: Cigarettes    Start date: 12/12/1974    Quit date: 12/12/1980    Years since quitting: 42.8   Smokeless tobacco: Never  Vaping Use   Vaping status: Never Used  Substance and Sexual Activity   Alcohol use: Yes    Comment: maybe one a month or so   Drug use: No   Sexual activity: Yes  Other Topics Concern   Not on file  Social History Narrative   Married. 3 kids. 7 grandkids.       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      Hobbies: enjoys his work, Mining engineer  guitar, Presenter, broadcasting, has enjoyed walking in past, beach, time with family/grandkids   Social Determinants of Corporate investment banker Strain: Not on file  Food Insecurity: Not on file  Transportation Needs: Not on file  Physical Activity: Not on file  Stress: Not on file  Social Connections: Not on file     Family History: The patient's family history includes CAD in his father and maternal grandfather; Dementia in his mother; Healthy in his sister; Heart disease in his paternal grandfather; Hyperlipidemia in his brother and maternal grandfather; Hypothyroidism in his brother. There is no history of Colon polyps, Colon cancer, Esophageal cancer, Stomach cancer, or Rectal cancer.  ROS:   Please see  the history of present illness.     All other systems reviewed and are negative.  EKGs/Labs/Other Studies Reviewed:    The following studies were reviewed today:   EKG:   06/15/23: Normal sinus rhythm, rate 85, no ST abnormalities  Recent Labs: 06/16/2023: Magnesium 1.9 10/02/2023: ALT 30; BUN 19; Creatinine, Ser 1.07; Hemoglobin 15.3; Platelets 204.0; Potassium 4.1; Sodium 137  Recent Lipid Panel    Component Value Date/Time   CHOL 152 10/02/2023 1422   CHOL 156 06/16/2023 0925   TRIG 131.0 10/02/2023 1422   HDL 43.40 10/02/2023 1422   HDL 52 06/16/2023 0925   CHOLHDL 3 10/02/2023 1422   VLDL 26.2 10/02/2023 1422   LDLCALC 82 10/02/2023 1422   LDLCALC 84 06/16/2023 0925    Physical Exam:    VS:  BP 128/70 (BP Location: Left Arm, Patient Position: Sitting, Cuff Size: Large)   Pulse 85   Ht 5\' 11"  (1.803 m)   Wt 258 lb (117 kg)   SpO2 98%   BMI 35.98 kg/m     Wt Readings from Last 3 Encounters:  10/18/23 258 lb (117 kg)  10/02/23 253 lb 6.4 oz (114.9 kg)  06/15/23 251 lb 3.2 oz (113.9 kg)     GEN:  Well nourished, well developed in no acute distress HEENT: Normal NECK: No JVD; No carotid bruits LYMPHATICS: No lymphadenopathy CARDIAC: RRR, no murmurs, rubs, gallops RESPIRATORY:  Clear to auscultation without rales, wheezing or rhonchi  ABDOMEN: Soft, non-tender, non-distended MUSCULOSKELETAL:  No edema; No deformity  SKIN: Warm and dry NEUROLOGIC:  Alert and oriented x 3 PSYCHIATRIC:  Normal affect   ASSESSMENT:    1. Coronary artery disease involving native coronary artery of native heart without angina pectoris   2. Essential hypertension   3. Hyperlipidemia, unspecified hyperlipidemia type   4. Snoring   5. Daytime somnolence     PLAN:    CAD: Calcium score 07/2018 was 587 (90th percentile).  Echocardiogram 07/11/2023 showed normal biventricular function, no significant valvular disease.  Stress PET 08/08/2023 showed normal perfusion, EF 52%, normal  myocardial blood flow reserve. -Continue aspirin 81 mg daily -Continue atorvastatin 40 mg daily.  LDL 82 on 10/02/2023, recommend adding zetia 10 mg daily.  Check fasting lipid panel in 2 months  Hypertension: On lisinopril-HCTZ 20-12.5 mg daily.  Appears controlled  Hyperlipidemia: LDL 82 on 10/02/2023, on atorvastatin 40 mg daily.  Recommend adding Zetia as above.  Snoring/daytime somnolence: Check Itamar sleep study.  STOP-BANG 6  RTC in 1 year   Medication Adjustments/Labs and Tests Ordered: Current medicines are reviewed at length with the patient today.  Concerns regarding medicines are outlined above.  Orders Placed This Encounter  Procedures   Lipid panel   [facility] Sleep Study   Meds ordered this encounter  Medications  ezetimibe (ZETIA) 10 MG tablet    Sig: Take 1 tablet (10 mg total) by mouth daily.    Dispense:  90 tablet    Refill:  2    Patient Instructions  Medication Instructions:  Start Zetia 10 mg daily Continue all current medications  *If you need a refill on your cardiac medications before your next appointment, please call your pharmacy*   Lab Work: Fasting Lipid in 2 months If you have labs (blood work) drawn today and your tests are completely normal, you will receive your results only by: MyChart Message (if you have MyChart) OR A paper copy in the mail If you have any lab test that is abnormal or we need to change your treatment, we will call you to review the results.   Testing/Procedures: Jaynie Bream?  Is a FDA cleared portable home sleep study test that uses a watch and 3 points of contact to monitor 7 different channels, including your heart rate, oxygen saturations, body position, snoring, and chest motion.  The study is easy to use from the comfort of your own home and accurately detect sleep apnea.  Before bed, you attach the chest sensor, attached the sleep apnea bracelet to your nondominant hand, and attach the finger probe.  After  the study, the raw data is downloaded from the watch and scored for apnea events.   For more information: [email or web address]  Patient Testing Instructions:  Do not put battery into the device until bedtime when you are ready to begin the test. Please call the support number if you need assistance after following the instructions below: 24 hour support line- [phone] or ITAMAR support at [phone] (option 2)  Download the IntelWatchPAT One" app through the google play store or App Store  Be sure to turn on or enable access to bluetooth in settlings on your smartphone/ device  Make sure no other bluetooth devices are on and within the vicinity of your smartphone/ device and WatchPAT watch during testing.  Make sure to leave your smart phone/ device plugged in and charging all night.  When ready for bed:  Follow the instructions step by step in the WatchPAT One App to activate the testing device. For additional instructions, including video instruction, visit the WatchPAT One video on Youtube. You can search for WatchPat One within Youtube (video is 4 minutes and 18 seconds) or enter: https://youtube/watch?[email or web address]_vbiwxE Please note: You will be prompted to enter a Pin to connect via bluetooth when starting the test. The PIN will be assigned to you when you receive the test.  The device is disposable, but it recommended that you retain the device until you receive a call letting you know the study has been received and the results have been interpreted.  We will let you know if the study did not transmit to [place] properly after the test is completed. You do not need to call us to confirm the receipt of the test.  Please complete the test within 48 hours of receiving PIN.   Frequently Asked Questions:  What is Watch Dennie Bible one?  A single use fully disposable home sleep apnea testing device and will not need to be returned after completion.  What are the requirements to use  WatchPAT one?  The be able to have a successful watchpat one sleep study, you should have your Watch pat one device, your smart phone, watch pat one app, your PIN number and Internet access What type of phone do I need?  You should have a smart phone that uses Android 5.1 and above or any Iphone with IOS 10 and above How can I download the WatchPAT one app?  Based on your device type search for WatchPAT one app either in google play for android devices or APP store for Iphone's Where will I get my PIN for the study?  Your PIN will be provided by your physician's office. It is used for authentication and if you lose/forget your PIN, please reach out to your providers office.  I do not have Internet at home. Can I do WatchPAT one study?  WatchPAT One needs Internet connection throughout the night to be able to transmit the sleep data. You can use your home/local internet or your cellular's data package. However, it is always recommended to use home/local Internet. It is estimated that between 20MB-30MB will be used with each study.However, the application will be looking for space in the phone to start the study.  What happens if I lose internet or bluetooth connection?  During the internet disconnection, your phone will not be able to transmit the sleep data. All the data, will be stored in your phone. As soon as the internet connection is back on, the phone will being sending the sleep data. During the bluetooth disconnection, WatchPAT one will not be able to to send the sleep data to your phone. Data will be kept in the [facility] one until two devices have bluetooth connection back on. As soon as the connection is back on, WatchPAT one will send the sleep data to the phone.  How long do I need to wear the WatchPAT one?  After you start the study, you should wear the device at least 6 hours.  How far should I keep my phone from the device?  During the night, your phone should be within 15 feet.   What happens if I leave the room for restroom or other reasons?  Leaving the room for any reason will not cause any problem. As soon as your get back to the room, both devices will reconnect and will continue to send the sleep data. Can I use my phone during the sleep study?  Yes, you can use your phone as usual during the study. But it is recommended to put your watchpat one on when you are ready to go to bed.  How will I get my study results?  A soon as you completed your study, your sleep data will be sent to the provider. They will then share the results with you when they are ready.      Follow-Up: At [facility], you and your health needs are our priority.  As part of our continuing mission to provide you with exceptional heart care, we have created designated Provider Care Teams.  These Care Teams include your primary Cardiologist (physician) and Advanced Practice Providers (APPs -  Physician Assistants and Nurse Practitioners) who all work together to provide you with the care you need, when you need it.  We recommend signing up for the patient portal called "MyChart".  Sign up information is provided on this After Visit Summary.  MyChart is used to connect with patients for Virtual Visits (Telemedicine).  Patients are able to view lab/test results, encounter notes, upcoming appointments, etc.  Non-urgent messages can be sent to your provider as well.   To learn more about what you can do with MyChart, go to [email or web address].    Your next appointment:   1  year(s)  Provider:   Little Ishikawa, MD        Signed, Little Ishikawa, MD  10/18/2023 12:37 PM    [facility]

## 2023-10-18 ENCOUNTER — Ambulatory Visit: Payer: 59 | Attending: Cardiology | Admitting: Cardiology

## 2023-10-18 VITALS — BP 128/70 | HR 85 | Ht 71.0 in | Wt 258.0 lb

## 2023-10-18 DIAGNOSIS — E785 Hyperlipidemia, unspecified: Secondary | ICD-10-CM | POA: Diagnosis not present

## 2023-10-18 DIAGNOSIS — R0683 Snoring: Secondary | ICD-10-CM | POA: Diagnosis not present

## 2023-10-18 DIAGNOSIS — I10 Essential (primary) hypertension: Secondary | ICD-10-CM

## 2023-10-18 DIAGNOSIS — R400 Somnolence: Secondary | ICD-10-CM

## 2023-10-18 DIAGNOSIS — I2510 Atherosclerotic heart disease of native coronary artery without angina pectoris: Secondary | ICD-10-CM

## 2023-10-18 MED ORDER — EZETIMIBE 10 MG PO TABS
10.0000 mg | ORAL_TABLET | Freq: Every day | ORAL | 2 refills | Status: DC
Start: 2023-10-18 — End: 2024-07-22

## 2023-10-18 NOTE — Progress Notes (Signed)
Patient agreement reviewed and signed on 10/18/2023.  WatchPAT issued to patient on 10/18/2023 by Lizabeth Leyden, RN. Patient aware to not open the WatchPAT box until contacted with the activation PIN. Patient profile initialized in CloudPAT on 10/18/2023 by Lizabeth Leyden, RN. Device serial number: [id number]  Please list Reason for Call as Advice Only and type "WatchPAT issued to patient" in the comment box.

## 2023-10-18 NOTE — Patient Instructions (Signed)
Medication Instructions:  Start Zetia 10 mg daily Continue all current medications  *If you need a refill on your cardiac medications before your next appointment, please call your pharmacy*   Lab Work: Fasting Lipid in 2 months If you have labs (blood work) drawn today and your tests are completely normal, you will receive your results only by: MyChart Message (if you have MyChart) OR A paper copy in the mail If you have any lab test that is abnormal or we need to change your treatment, we will call you to review the results.   Testing/Procedures: Travis Burgess?  Is a FDA cleared portable home sleep study test that uses a watch and 3 points of contact to monitor 7 different channels, including your heart rate, oxygen saturations, body position, snoring, and chest motion.  The study is easy to use from the comfort of your own home and accurately detect sleep apnea.  Before bed, you attach the chest sensor, attached the sleep apnea bracelet to your nondominant hand, and attach the finger probe.  After the study, the raw data is downloaded from the watch and scored for apnea events.   For more information: [email or web address]  Patient Testing Instructions:  Do not put battery into the device until bedtime when you are ready to begin the test. Please call the support number if you need assistance after following the instructions below: 24 hour support line- [phone] or ITAMAR support at [phone] (option 2)  Download the IntelWatchPAT One" app through the google play store or App Store  Be sure to turn on or enable access to bluetooth in settlings on your smartphone/ device  Make sure no other bluetooth devices are on and within the vicinity of your smartphone/ device and WatchPAT watch during testing.  Make sure to leave your smart phone/ device plugged in and charging all night.  When ready for bed:  Follow the instructions step by step in the WatchPAT One  App to activate the testing device. For additional instructions, including video instruction, visit the WatchPAT One video on Youtube. You can search for WatchPat One within Youtube (video is 4 minutes and 18 seconds) or enter: https://youtube/watch?[email or web address]_vbiwxE Please note: You will be prompted to enter a Pin to connect via bluetooth when starting the test. The PIN will be assigned to you when you receive the test.  The device is disposable, but it recommended that you retain the device until you receive a call letting you know the study has been received and the results have been interpreted.  We will let you know if the study did not transmit to [place] properly after the test is completed. You do not need to call us to confirm the receipt of the test.  Please complete the test within 48 hours of receiving PIN.   Frequently Asked Questions:  What is Watch Dennie Bible one?  A single use fully disposable home sleep apnea testing device and will not need to be returned after completion.  What are the requirements to use WatchPAT one?  The be able to have a successful watchpat one sleep study, you should have your Watch pat one device, your smart phone, watch pat one app, your PIN number and Internet access What type of phone do I need?  You should have a smart phone that uses Android 5.1 and above or any Iphone with IOS 10 and above How can I download the WatchPAT one app?  Based on your device type search for WatchPAT one  app either in google play for android devices or APP store for Iphone's Where will I get my PIN for the study?  Your PIN will be provided by your physician's office. It is used for authentication and if you lose/forget your PIN, please reach out to your providers office.  I do not have Internet at home. Can I do WatchPAT one study?  WatchPAT One needs Internet connection throughout the night to be able to transmit the sleep data. You can use your home/local internet or your cellular's data  package. However, it is always recommended to use home/local Internet. It is estimated that between 20MB-30MB will be used with each study.However, the application will be looking for space in the phone to start the study.  What happens if I lose internet or bluetooth connection?  During the internet disconnection, your phone will not be able to transmit the sleep data. All the data, will be stored in your phone. As soon as the internet connection is back on, the phone will being sending the sleep data. During the bluetooth disconnection, WatchPAT one will not be able to to send the sleep data to your phone. Data will be kept in the [facility] one until two devices have bluetooth connection back on. As soon as the connection is back on, WatchPAT one will send the sleep data to the phone.  How long do I need to wear the WatchPAT one?  After you start the study, you should wear the device at least 6 hours.  How far should I keep my phone from the device?  During the night, your phone should be within 15 feet.  What happens if I leave the room for restroom or other reasons?  Leaving the room for any reason will not cause any problem. As soon as your get back to the room, both devices will reconnect and will continue to send the sleep data. Can I use my phone during the sleep study?  Yes, you can use your phone as usual during the study. But it is recommended to put your watchpat one on when you are ready to go to bed.  How will I get my study results?  A soon as you completed your study, your sleep data will be sent to the provider. They will then share the results with you when they are ready.      Follow-Up: At [facility], you and your health needs are our priority.  As part of our continuing mission to provide you with exceptional heart care, we have created designated Provider Care Teams.  These Care Teams include your primary Cardiologist (physician) and Advanced Practice Providers  (APPs -  Physician Assistants and Nurse Practitioners) who all work together to provide you with the care you need, when you need it.  We recommend signing up for the patient portal called "MyChart".  Sign up information is provided on this After Visit Summary.  MyChart is used to connect with patients for Virtual Visits (Telemedicine).  Patients are able to view lab/test results, encounter notes, upcoming appointments, etc.  Non-urgent messages can be sent to your provider as well.   To learn more about what you can do with MyChart, go to [email or web address].    Your next appointment:   1 year(s)  Provider:   Little Ishikawa, MD

## 2023-12-15 ENCOUNTER — Other Ambulatory Visit: Payer: Self-pay | Admitting: Family Medicine

## 2023-12-26 ENCOUNTER — Encounter: Payer: Self-pay | Admitting: Family Medicine

## 2024-01-04 ENCOUNTER — Encounter: Payer: Self-pay | Admitting: Family Medicine

## 2024-01-04 ENCOUNTER — Ambulatory Visit: Payer: 59 | Admitting: Family Medicine

## 2024-01-04 ENCOUNTER — Other Ambulatory Visit: Payer: Self-pay | Admitting: Family Medicine

## 2024-01-04 DIAGNOSIS — I2510 Atherosclerotic heart disease of native coronary artery without angina pectoris: Secondary | ICD-10-CM

## 2024-01-04 DIAGNOSIS — I10 Essential (primary) hypertension: Secondary | ICD-10-CM

## 2024-01-04 DIAGNOSIS — E785 Hyperlipidemia, unspecified: Secondary | ICD-10-CM

## 2024-01-04 MED ORDER — SEMAGLUTIDE-WEIGHT MANAGEMENT 0.5 MG/0.5ML ~~LOC~~ SOAJ: 0.5000 mg | SUBCUTANEOUS | 0 refills | Status: DC

## 2024-01-04 MED ORDER — SEMAGLUTIDE-WEIGHT MANAGEMENT 0.25 MG/0.5ML ~~LOC~~ SOAJ: 0.2500 mg | SUBCUTANEOUS | 0 refills | Status: AC

## 2024-01-04 NOTE — Telephone Encounter (Signed)
 Pt needing PA for Wegovy .

## 2024-01-04 NOTE — Patient Instructions (Addendum)
 blood pressure slightly high with weight gain- wants to work on reversing this as above with Wegovy  or Zepbound - close follow up at may visit  Wegovy  0.25 mg for one month then 0.5 mg for another month- after that if still losing weight we can continue 0.5 mg or increase to 1 mg   Recommended follow up: Return for next already scheduled visit or sooner if needed.

## 2024-01-04 NOTE — Progress Notes (Signed)
 Phone [phone] In person visit   Subjective:   Travis Burgess is a 68 y.o. year old very pleasant male patient who presents for/with See problem oriented charting Chief Complaint  Patient presents with   Weight Loss    Pt would like to discuss Wegovy    Past Medical History-  Patient Active Problem List   Diagnosis Date Noted   CAD (coronary artery disease) 03/20/2023    Priority: High   Hyperlipidemia 02/08/2022    Priority: Medium    Essential hypertension 02/08/2022    Priority: Medium    Obesity, morbid (HCC) 01/04/2024    Medications- reviewed and updated Current Outpatient Medications  Medication Sig Dispense Refill   aspirin  EC 81 MG tablet Take 1 tablet (81 mg total) by mouth daily. Swallow whole.     atorvastatin  (LIPITOR) 40 MG tablet Take 1 tablet (40 mg total) by mouth daily. 90 tablet 3   ezetimibe  (ZETIA ) 10 MG tablet Take 1 tablet (10 mg total) by mouth daily. 90 tablet 2   fluticasone  (FLONASE ) 50 MCG/ACT nasal spray Place 2 sprays into both nostrils daily. 16 g 6   lisinopril -hydrochlorothiazide  (ZESTORETIC ) 20-12.5 MG tablet TAKE 1 TABLET BY MOUTH EVERY DAY 30 tablet 2   Saline (SIMPLY SALINE) 0.9 % AERS Place 2 each into the nose as directed. Use nightly for sinus hygiene long-term.  Can also be used as many times daily as desired to assist with clearing congested sinuses. 127 mL 11   Semaglutide -Weight Management 0.25 MG/0.5ML SOAJ Inject 0.25 mg into the skin once a week for 28 days. 2 mL 0   [START ON 02/02/2024] Semaglutide -Weight Management 0.5 MG/0.5ML SOAJ Inject 0.5 mg into the skin once a week for 28 days. 2 mL 0   triamcinolone  cream (KENALOG ) 0.1 % Apply 1 Application topically 2 (two) times daily. For 7-10 days maximum 80 g 0   No current facility-administered medications for this visit.     Objective:  BP (!) 140/72   Pulse 77   Temp (!) 97.2 F (36.2 C)   Ht 5' 11 (1.803 m)   Wt 261 lb 3.2 oz (118.5 kg)   SpO2 96%   BMI 36.43 kg/m   Gen: NAD, resting comfortably    Assessment and Plan   # Obesity- technically morbid obesity with BMI over 35 and hypertension and coronary artery disease  S:weight was down 8 lbs at last physical April 2024 but now back up 1 lbs from that low. Insurance had said could get Wegovy . Hed be interested in zepbound   Wt Readings from Last 3 Encounters:  01/04/24 261 lb 3.2 oz (118.5 kg)  10/18/23 258 lb (117 kg)  10/02/23 253 lb 6.4 oz (114.9 kg)  A/P: morbid obesity noted.  Patient well aware that healthy eating and regular exercise needed. A lot of food noise and feels that that is a big barrier for him.  With underlying CAD and prediabetes we discussed options such as Wegovy  or Zepbound -he would like to pursue this-we went over benefits and risks.  We discussed making sure to get adequate protein and doing weight training to make sure to not lose muscle mass - Try Wegovy  through insurance and can submit prior authorization - Discussed Zepbound  as backup if not covered  #hypertension S: medication: Lisinopril  hydrochlorothiazide  20-12.5 mg Home readings #s: slightly high at dentist lately  BP Readings from Last 3 Encounters:  01/04/24 (!) 140/72  10/18/23 128/70  10/02/23 124/70  A/P: blood pressure slightly high with weight  gain- wants to work on reversing this as above with Wegovy  or Zepbound - close follow up at may visit   #CAD #hyperlipidemia- CT calcium  08/22/18 with score of 587 at 90%-saw cardiology Dr. Kate in 2024 with low risk NM pet CT and echocardiogram.  S: Medication: Atorvastatin  40 mg, aspirin  81 mg, zetia  10 mg -no chest pain or shortness of breath  Lab Results  Component Value Date   CHOL 152 10/02/2023   HDL 43.40 10/02/2023   LDLCALC 82 10/02/2023   TRIG 131.0 10/02/2023   CHOLHDL 3 10/02/2023   A/P: coronary artery disease  asymptomatic continue current medications  Lipids  - slightly above goal but added zetia  so hopefully at goal- wait until next visit  #  Hyperglycemia/insulin resistance/prediabetes S:  Medication: none Lab Results  Component Value Date   HGBA1C 6.0 10/02/2023  A/P: hopefully stable or improved but weight is up some so I have some concern- we are hoping to address this with Wegovy  or zebpound and weight loss  Recommended follow up: Return for next already scheduled visit or sooner if needed. Future Appointments  Date Time Provider Department Center  04/08/2024  8:00 AM Katrinka Garnette KIDD, MD [facility]    Lab/Order associations:   ICD-10-CM   1. Obesity, morbid (HCC) Chronic E66.01     2. Coronary artery disease involving native coronary artery of native heart without angina pectoris  I25.10     3. Hyperlipidemia, unspecified hyperlipidemia type  E78.5     4. Essential hypertension  I10       Meds ordered this encounter  Medications   Semaglutide -Weight Management 0.25 MG/0.5ML SOAJ    Sig: Inject 0.25 mg into the skin once a week for 28 days.    Dispense:  2 mL    Refill:  0    CAD and prediabetes as reasons for this in addition to obesity   Semaglutide -Weight Management 0.5 MG/0.5ML SOAJ    Sig: Inject 0.5 mg into the skin once a week for 28 days.    Dispense:  2 mL    Refill:  0    Return precautions advised.  Garnette Katrinka, MD

## 2024-01-08 ENCOUNTER — Telehealth: Payer: Self-pay

## 2024-01-08 ENCOUNTER — Other Ambulatory Visit (HOSPITAL_COMMUNITY): Payer: Self-pay

## 2024-01-08 NOTE — Telephone Encounter (Signed)
 Pharmacy Patient Advocate Encounter   Received notification from RX Request Messages that prior authorization for Wegovy  0.25mg /0.67ml is required/requested.   Insurance verification completed.   The patient is insured through [facility] .   Per test claim: PA required; PA submitted to above mentioned insurance via CoverMyMeds Key/confirmation #/EOC [facility] Status is pending

## 2024-01-08 NOTE — Telephone Encounter (Signed)
 Pharmacy Patient Advocate Encounter  Received notification from [employer] that Prior Authorization for Wegovy  0.25mg /0.1ml has been DENIED.  See denial reason below. No denial letter attached in CMM. Will attach denial letter to Media tab once received.   PA #/Case ID/Reference #: [id number]

## 2024-01-15 ENCOUNTER — Other Ambulatory Visit (HOSPITAL_COMMUNITY): Payer: Self-pay

## 2024-01-15 ENCOUNTER — Telehealth: Payer: Self-pay

## 2024-01-15 NOTE — Telephone Encounter (Signed)
**Note De-Identified Travis Burgess Obfuscation** Ordering provider: Dr Bjorn Pippin Associated diagnoses: Snoring-R06.83 and Somnolence-R40.0 WatchPAT PA obtained on 01/15/2024 by Latese Dufault, Lorelle Formosa, LPN. Authorization: Per the [facility] Provider Portal: 16[address] Description Sleep study, unattended, simultaneous recording; heart rate, oxygen saturation, respiratory analysis (eg, by airflow or peripheral arterial tone), and sleep time Inquiry summary Notification/Prior Authorization not required for this service. Patient notified of PIN (1234) on 01/15/2024 Travis Burgess Notification Method: phone.  Phone note routed to covering staff for follow-up.

## 2024-01-16 NOTE — Telephone Encounter (Signed)
 Called patient and left message on personal cell  to call office for any questions about watchpat sleep study.

## 2024-01-25 ENCOUNTER — Encounter (INDEPENDENT_AMBULATORY_CARE_PROVIDER_SITE_OTHER): Payer: Self-pay | Admitting: Cardiology

## 2024-01-25 DIAGNOSIS — G4733 Obstructive sleep apnea (adult) (pediatric): Secondary | ICD-10-CM

## 2024-01-28 NOTE — Procedures (Signed)
   SLEEP STUDY REPORT Patient Information Study Date: 01/25/2024 Patient Name: Travis Burgess Patient ID: [id number] Birth Date: 1956-10-18 Age: 68 Gender: Male BMI: 36.1 (W=258 lb, H=5' 11'') Stopbang: 6 Referring Physician: Epifanio Lesches, MD  TEST DESCRIPTION: Home sleep apnea testing was completed using the WatchPat, a Type 1 device, utilizing peripheral arterial tonometry (PAT), chest movement, actigraphy, pulse oximetry, pulse rate, body position and snore. AHI was calculated with apnea and hypopnea using valid sleep time as the denominator. RDI includes apneas, hypopneas, and RERAs. The data acquired and the scoring of sleep and all associated events were performed in accordance with the recommended standards and specifications as outlined in the AASM Manual for the Scoring of Sleep and Associated Events 2.2.0 (2015).  FINDINGS:  1. Mild Obstructive Sleep Apnea with AHI 8.5/hr overall and moderate during REM sleep with REM AHI 24.4/hr.  2. No Central Sleep Apnea with pAHIc 0.7/hr.  3. Oxygen desaturations as low as 81%.  4. Moderate to severe snoring was present. O2 sats were < 88% for 1.8 min.  5. Total sleep time was 6 hrs and 47 min.  6. 19.6% of total sleep time was spent in REM sleep.  7. Normal sleep onset latency at 19 min.  8. Shortened REM sleep onset latency at 81 min.  9. Total awakenings were 13. 10. Arrhythmia detection: None  DIAGNOSIS: Mild Obstructive Sleep Apnea (G47.33)  RECOMMENDATIONS: 1. Clinical correlation of these findings is necessary. The decision to treat obstructive sleep apnea (OSA) is usually based on the presence of apnea symptoms or the presence of associated medical conditions such as Hypertension, Congestive Heart Failure, Atrial Fibrillation or Obesity. The most common symptoms of OSA are snoring, gasping for breath while sleeping, daytime sleepiness and fatigue.  2. Initiating apnea therapy is recommended given the presence of  symptoms and/or associated conditions. Recommend proceeding with one of the following:   a. Auto-CPAP therapy with a pressure range of 5-20cm H2O.   b. An oral appliance (OA) that can be obtained from certain dentists with expertise in sleep medicine. These are primarily of use in non-obese patients with mild and moderate disease.   c. An ENT consultation which may be useful to look for specific causes of obstruction and possible treatment options.   d. If patient is intolerant to PAP therapy, consider referral to ENT for evaluation for hypoglossal nerve stimulator.  3. Close follow-up is necessary to ensure success with CPAP or oral appliance therapy for maximum benefit .  4. A follow-up oximetry study on CPAP is recommended to assess the adequacy of therapy and determine the need for supplemental oxygen or the potential need for Bi-level therapy. An arterial blood gas to determine the adequacy of baseline ventilation and oxygenation should also be considered.  5. Healthy sleep recommendations include: adequate nightly sleep (normal 7-9 hrs/night), avoidance of caffeine after noon and alcohol near bedtime, and maintaining a sleep environment that is cool, dark and quiet.  6. Weight loss for overweight patients is recommended. Even modest amounts of weight loss can significantly improve the severity of sleep apnea.  7. Snoring recommendations include: weight loss where appropriate, side sleeping, and avoidance of alcohol before bed.  8. Operation of motor vehicle should be avoided when sleepy.  Signature: Armanda Magic, MD; [facility]; Diplomat, American Board of Sleep Medicine Electronically Signed: 01/28/2024 8:14:12 PM

## 2024-01-29 ENCOUNTER — Telehealth: Payer: Self-pay | Admitting: Pharmacist

## 2024-01-29 ENCOUNTER — Other Ambulatory Visit (HOSPITAL_COMMUNITY): Payer: Self-pay

## 2024-01-29 ENCOUNTER — Telehealth: Payer: Self-pay | Admitting: Pharmacy Technician

## 2024-01-29 ENCOUNTER — Telehealth: Payer: Self-pay

## 2024-01-29 ENCOUNTER — Ambulatory Visit: Attending: Cardiology

## 2024-01-29 DIAGNOSIS — R400 Somnolence: Secondary | ICD-10-CM

## 2024-01-29 DIAGNOSIS — R0683 Snoring: Secondary | ICD-10-CM

## 2024-01-29 NOTE — Telephone Encounter (Signed)
 Notified patient of sleep study results and recommendations. Patient is interested in [place]. Pharmacist is currently working on PA for either medications. Informed patient that these neither of these medications is a replacement for CPAP Therapy. All questions were answered and patient verbalized understanding. Will contact patient once PA has a decision. Patient will wait for callback before starting CPAP Therapy.

## 2024-01-29 NOTE — Telephone Encounter (Signed)
 Pharmacy Patient Advocate Encounter  Received notification from  [employer]  that Prior Authorization for zepbound has been DENIED.  Full denial letter will be uploaded to the media tab. See denial reason below.   PA #/Case ID/Reference #: [id number]

## 2024-01-29 NOTE — Telephone Encounter (Signed)
 Pharmacy Patient Advocate Encounter   Received notification from Pt Calls Messages that prior authorization for Zepbound is required/requested.   Insurance verification completed.   The patient is insured through  [employer]  .   Per test claim: PA required; PA submitted to above mentioned insurance via CoverMyMeds Key/confirmation #/EOC [facility] Status is pending

## 2024-01-29 NOTE — Telephone Encounter (Signed)
 Please run PA on Zepbound, OSA diagnosis

## 2024-01-29 NOTE — Telephone Encounter (Signed)
-----   Message from Armanda Magic sent at 01/28/2024  8:15 PM EST ----- Please let patient know that they have sleep apnea and recommend treating with CPAP.  Please order an auto CPAP from 4-15cm H2O with heated humidity and mask of choice.  Order overnight pulse ox on CPAP.  Followup with me in 6 weeks.

## 2024-02-01 ENCOUNTER — Encounter: Payer: Self-pay | Admitting: Family Medicine

## 2024-02-02 ENCOUNTER — Other Ambulatory Visit (HOSPITAL_BASED_OUTPATIENT_CLINIC_OR_DEPARTMENT_OTHER): Payer: Self-pay

## 2024-02-02 MED ORDER — ZEPBOUND 2.5 MG/0.5ML ~~LOC~~ SOAJ
2.5000 mg | SUBCUTANEOUS | 2 refills | Status: DC
  Filled 2024-02-02: qty 2, 28d supply, fill #0

## 2024-02-08 ENCOUNTER — Telehealth: Payer: Self-pay

## 2024-02-08 NOTE — Telephone Encounter (Signed)
-----   Message from Armanda Magic sent at 01/28/2024  8:15 PM EST ----- Please let patient know that they have sleep apnea and recommend treating with CPAP.  Please order an auto CPAP from 4-15cm H2O with heated humidity and mask of choice.  Order overnight pulse ox on CPAP.  Followup with me in 6 weeks.

## 2024-02-08 NOTE — Telephone Encounter (Signed)
 Left VM for patient to return call in reference to starting  CPAP Therapy.

## 2024-02-24 ENCOUNTER — Other Ambulatory Visit: Payer: Self-pay | Admitting: Internal Medicine

## 2024-02-24 DIAGNOSIS — J324 Chronic pansinusitis: Secondary | ICD-10-CM

## 2024-02-26 ENCOUNTER — Other Ambulatory Visit: Payer: Self-pay

## 2024-02-26 ENCOUNTER — Other Ambulatory Visit (HOSPITAL_BASED_OUTPATIENT_CLINIC_OR_DEPARTMENT_OTHER): Payer: Self-pay

## 2024-02-26 MED ORDER — TIRZEPATIDE-WEIGHT MANAGEMENT 5 MG/0.5ML ~~LOC~~ SOAJ
5.0000 mg | SUBCUTANEOUS | 2 refills | Status: DC
Start: 2024-02-26 — End: 2024-04-23
  Filled 2024-02-26: qty 2, 28d supply, fill #0
  Filled 2024-03-27 (×2): qty 2, 28d supply, fill #1

## 2024-03-16 ENCOUNTER — Other Ambulatory Visit: Payer: Self-pay | Admitting: Family Medicine

## 2024-03-27 ENCOUNTER — Other Ambulatory Visit (HOSPITAL_BASED_OUTPATIENT_CLINIC_OR_DEPARTMENT_OTHER): Payer: Self-pay

## 2024-04-08 ENCOUNTER — Encounter: Payer: Self-pay | Admitting: Family Medicine

## 2024-04-08 ENCOUNTER — Ambulatory Visit (INDEPENDENT_AMBULATORY_CARE_PROVIDER_SITE_OTHER): Payer: 59 | Admitting: Family Medicine

## 2024-04-08 VITALS — BP 122/64 | HR 81 | Temp 97.5°F | Ht 71.0 in | Wt 243.6 lb

## 2024-04-08 DIAGNOSIS — E669 Obesity, unspecified: Secondary | ICD-10-CM

## 2024-04-08 DIAGNOSIS — I2510 Atherosclerotic heart disease of native coronary artery without angina pectoris: Secondary | ICD-10-CM | POA: Diagnosis not present

## 2024-04-08 DIAGNOSIS — Z125 Encounter for screening for malignant neoplasm of prostate: Secondary | ICD-10-CM

## 2024-04-08 DIAGNOSIS — Z0000 Encounter for general adult medical examination without abnormal findings: Secondary | ICD-10-CM

## 2024-04-08 DIAGNOSIS — Z131 Encounter for screening for diabetes mellitus: Secondary | ICD-10-CM

## 2024-04-08 DIAGNOSIS — I10 Essential (primary) hypertension: Secondary | ICD-10-CM | POA: Diagnosis not present

## 2024-04-08 DIAGNOSIS — E785 Hyperlipidemia, unspecified: Secondary | ICD-10-CM

## 2024-04-08 LAB — CBC WITH DIFFERENTIAL/PLATELET
Basophils Absolute: 0 10*3/uL (ref 0.0–0.1)
Basophils Relative: 0.5 % (ref 0.0–3.0)
Eosinophils Absolute: 0.3 10*3/uL (ref 0.0–0.7)
Eosinophils Relative: 3.3 % (ref 0.0–5.0)
HCT: 45.2 % (ref 39.0–52.0)
Hemoglobin: 15.3 g/dL (ref 13.0–17.0)
Lymphocytes Relative: 20.3 % (ref 12.0–46.0)
Lymphs Abs: 1.8 10*3/uL (ref 0.7–4.0)
MCHC: 33.9 g/dL (ref 30.0–36.0)
MCV: 90.2 fl (ref 78.0–100.0)
Monocytes Absolute: 0.6 10*3/uL (ref 0.1–1.0)
Monocytes Relative: 6.5 % (ref 3.0–12.0)
Neutro Abs: 6 10*3/uL (ref 1.4–7.7)
Neutrophils Relative %: 69.4 % (ref 43.0–77.0)
Platelets: 206 10*3/uL (ref 150.0–400.0)
RBC: 5 Mil/uL (ref 4.22–5.81)
RDW: 13.4 % (ref 11.5–15.5)
WBC: 8.7 10*3/uL (ref 4.0–10.5)

## 2024-04-08 LAB — COMPREHENSIVE METABOLIC PANEL WITH GFR
ALT: 23 U/L (ref 0–53)
AST: 20 U/L (ref 0–37)
Albumin: 4.5 g/dL (ref 3.5–5.2)
Alkaline Phosphatase: 80 U/L (ref 39–117)
BUN: 21 mg/dL (ref 6–23)
CO2: 24 meq/L (ref 19–32)
Calcium: 9.3 mg/dL (ref 8.4–10.5)
Chloride: 103 meq/L (ref 96–112)
Creatinine, Ser: 1.27 mg/dL (ref 0.40–1.50)
GFR: 58.1 mL/min — ABNORMAL LOW (ref >60.00)
Glucose, Bld: 100 mg/dL — ABNORMAL HIGH (ref 70–99)
Potassium: 4.1 meq/L (ref 3.5–5.1)
Sodium: 138 meq/L (ref 135–145)
Total Bilirubin: 1 mg/dL (ref 0.2–1.2)
Total Protein: 7.1 g/dL (ref 6.0–8.3)

## 2024-04-08 LAB — LIPID PANEL
Cholesterol: 105 mg/dL (ref 0–200)
HDL: 39.6 mg/dL (ref >39.00)
LDL Cholesterol: 47 mg/dL (ref 0–99)
NonHDL: 65.47
Total CHOL/HDL Ratio: 3
Triglycerides: 93 mg/dL (ref 0.0–149.0)
VLDL: 18.6 mg/dL (ref 0.0–40.0)

## 2024-04-08 LAB — PSA: PSA: 1.33 ng/mL (ref 0.10–4.00)

## 2024-04-08 LAB — HEMOGLOBIN A1C: Hgb A1c MFr Bld: 5.6 % (ref 4.6–6.5)

## 2024-04-08 NOTE — Patient Instructions (Addendum)
 Please stop by lab before you go If you have mychart- we will send your results within 3 business days of us  receiving them.  If you do not have mychart- we will call you about results within 5 business days of us  receiving them.  *please also note that you will see labs on mychart as soon as they post. I will later go in and write notes on them- will say "notes from Dr. Arlene Ben"    Recommended follow up: Return in about 6 months (around 10/09/2024) for followup or sooner if needed.Schedule b4 you leave.

## 2024-04-08 NOTE — Progress Notes (Signed)
 Phone: [phone]   Subjective:  Patient presents today for their annual physical. Chief complaint-noted.   See problem oriented charting- ROS- full  review of systems was completed and negative  except for: shortness of breath with stairs but stable  The following were reviewed and entered/updated in epic: Past Medical History:  Diagnosis Date   Chronic kidney disease    kidney stones   GERD (gastroesophageal reflux disease)    Hyperlipidemia    Hypertension    Pneumothorax    chest tube around age 25   Patient Active Problem List   Diagnosis Date Noted   CAD (coronary artery disease) 03/20/2023    Priority: High   Hyperlipidemia 02/08/2022    Priority: Medium    Essential hypertension 02/08/2022    Priority: Medium    Obesity, morbid (HCC) 01/04/2024   Past Surgical History:  Procedure Laterality Date   BACK SURGERY     herniated disc around age 67   CYSTOSCOPY W/ URETERAL STENT PLACEMENT  12/13/2011   kidney stone. Procedure: CYSTOSCOPY WITH RETROGRADE PYELOGRAM/URETERAL STENT PLACEMENT;  Surgeon: Edmund Gouge, MD;  Location: [facility];  Service: Urology;  Laterality: Left;   NOSE SURGERY  35 years ago   broken nose playing soccer    Family History  Problem Relation Age of Onset   Dementia Mother        started in 20s   CAD Father        bypass at 70 or 60. cigars. died 51-87   Healthy Sister    Hyperlipidemia Brother    Hypothyroidism Brother    Hyperlipidemia Maternal Grandfather    CAD Maternal Grandfather    Heart disease Paternal Grandfather        died rather young- 43s   Colon polyps Neg Hx    Colon cancer Neg Hx    Esophageal cancer Neg Hx    Stomach cancer Neg Hx    Rectal cancer Neg Hx     Medications- reviewed and updated Current Outpatient Medications  Medication Sig Dispense Refill   aspirin  EC 81 MG tablet Take 1 tablet (81 mg total) by mouth daily. Swallow whole.     atorvastatin  (LIPITOR) 40 MG tablet Take 1 tablet (40 mg  total) by mouth daily. 90 tablet 3   fluticasone  (FLONASE ) 50 MCG/ACT nasal spray SPRAY 2 SPRAYS INTO EACH NOSTRIL EVERY DAY 16 mL 6   lisinopril -hydrochlorothiazide  (ZESTORETIC ) 20-12.5 MG tablet TAKE 1 TABLET BY MOUTH EVERY DAY 30 tablet 2   Saline (SIMPLY SALINE) 0.9 % AERS Place 2 each into the nose as directed. Use nightly for sinus hygiene long-term.  Can also be used as many times daily as desired to assist with clearing congested sinuses. 127 mL 11   tirzepatide  (ZEPBOUND ) 5 MG/0.5ML Pen Inject 5 mg into the skin once a week. 2 mL 2   triamcinolone  cream (KENALOG ) 0.1 % Apply 1 Application topically 2 (two) times daily. For 7-10 days maximum 80 g 0   ezetimibe  (ZETIA ) 10 MG tablet Take 1 tablet (10 mg total) by mouth daily. 90 tablet 2   No current facility-administered medications for this visit.    Allergies-reviewed and updated No Known Allergies  Social History   Social History Narrative   Married. 3 kids. 7 grandkids.       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 Oropharynx normal Neck: no thyromegaly CV: RRR no murmurs rubs or gallops Lungs: CTAB no crackles, wheeze, rhonchi Abdomen: soft/nontender/nondistended/normal bowel sounds. No rebound or guarding.  Ext: no edema Skin: warm, dry Neuro: grossly normal, moves all extremities, PERRLA Declines genitourinary and rectal exam    Assessment and Plan  68 y.o. male presenting for annual physical.  Health Maintenance counseling: 1. Anticipatory guidance: Patient counseled regarding regular dental exams  -q6 months, eye exams -yearly,  avoiding smoking and second hand smoke, limiting alcohol to 2 beverages per day - 3 a year maybe, no illicit drugs .   2. Risk factor reduction:  Advised patient of need for regular exercise and diet rich and fruits and vegetables to reduce risk of heart attack and stroke.  Exercise- not at present other than walking dog. Mentioned possible rucking with this. No weights either- things have been really busy for him Diet/weight management-down 18 lbs on Zepbound  since February! Really good progress.  Down 7 pounds from physical last year. Finished 9th week recently. Cravings down substantially. Still losing weight on the 5 mg zepbound . Trying to eat his protein- targetting 100g a day. Trying to lmit carbs Wt Readings from Last 3 Encounters:  04/08/24 243 lb 9.6 oz (110.5 kg)  01/04/24 261 lb 3.2 oz (118.5 kg)  10/18/23 258 lb (117 kg)  3. Immunizations/screenings/ancillary studies-outside of COVID-19 vaccination but otherwise up-to-date  Immunization History  Administered Date(s) Administered   Fluad Trivalent(High Dose 65+) 09/18/2023   Fluzone Influenza virus vaccine,trivalent (IIV3), split virus 07/29/2016, 09/12/2019   Influenza Split 12/14/2011   Influenza,inj,Quad PF,6+ Mos 09/11/2021   PFIZER(Purple Top)SARS-COV-2 Vaccination 01/08/2020, 02/05/2020, 11/15/2020   PNEUMOCOCCAL CONJUGATE-20 03/20/2023   Pfizer Covid-19 Vaccine Bivalent Booster 89yrs & up 08/30/2021   Pneumococcal Conjugate-13 04/15/2021   Tdap 05/25/2021   Zoster Recombinant(Shingrix) 05/08/2020, 10/16/2020  4. Prostate cancer screening-  low risk prior trend- update psa today  Lab Results  Component Value Date   PSA 1.85 10/02/2023   PSA 1.69 04/17/2023   PSA 1.88 03/20/2023  5. Colon cancer screening - history of sessile serrated polyp 04/2022 - 3 year repeat  6. Skin cancer screening-referred back to dermatology last year- Dr. Rochelle Chu removed it but thankfully reports benign.  7. Smoking  associated screening (lung cancer screening, AAA screen 65-75, UA)- former smoker- quit over 40 years ago- no regular screening needed other than 1x AAA screening at 65-negative on 04/25/2023 8. STD screening - only active with wife   Status of chronic or acute concerns   #hypertension S: medication: Lisinopril  hydrochlorothiazide  20-12.5 mg -was able to come off meds in the past with weight loss  A/P: blood pressure looks grea twith weight loss- discussed as continues to lose weight we may need to reduce medications- he will reach out if blood pressure averaging 110 or less   #CAD #hyperlipidemia- CT calcium  08/22/18 with score of 587 at 90%-saw cardiology Dr. Alda Amas in 2024 with low risk NM pet CT and echocardiogram.  S: Medication: Atorvastatin  40 mg, aspirin  81 mg, zetia  10 mg Lab Results  Component Value Date   CHOL 152 10/02/2023   HDL  43.40 10/02/2023   LDLCALC 82 10/02/2023   TRIG 131.0 10/02/2023   CHOLHDL 3 10/02/2023   A/P: coronary artery disease- asymptomatic no chest pain . Stable shortness of breath with stairs hopefully stable- update lipid panel today. Continue current meds for now   # Pulmonary nodules-noted on PET/CT 08/08/2023-with 1 year follow-up scan recommended if high risk.  Would consider intermediate risk with distant smoking history- he's on board for this- will order in september  -Also fatty liver noted- but working on weight loss  #mild sleep apnea- using CPAP   #Leukocytosis-mild but other cell lines ok. We don't have access to pathologist smear review- recheck CBC- if worsening could consider  Recommended follow up: Return in about 6 months (around 10/09/2024) for followup or sooner if needed.Schedule b4 you leave.  Lab/Order associations: fasting   ICD-10-CM   1. Preventative health care  Z00.00     2. Screening for prostate cancer  Z12.5 PSA    3. Coronary artery disease involving native coronary artery of native heart without angina pectoris   I25.10 Comprehensive metabolic panel with GFR    CBC with Differential/Platelet    Lipid panel    4. Essential hypertension  I10 Comprehensive metabolic panel with GFR    CBC with Differential/Platelet    Lipid panel    5. Hyperlipidemia, unspecified hyperlipidemia type  E78.5 Comprehensive metabolic panel with GFR    CBC with Differential/Platelet    Lipid panel    6. Screening for diabetes mellitus  Z13.1 Hemoglobin A1c    7. Obesity (BMI 30-39.9)  E66.9 Hemoglobin A1c      No orders of the defined types were placed in this encounter.   Return precautions advised.  Clarisa Crooked, MD

## 2024-04-09 ENCOUNTER — Ambulatory Visit: Payer: Self-pay

## 2024-04-23 ENCOUNTER — Encounter: Payer: Self-pay | Admitting: Family Medicine

## 2024-04-23 ENCOUNTER — Other Ambulatory Visit: Payer: Self-pay | Admitting: Family Medicine

## 2024-04-23 MED ORDER — TIRZEPATIDE-WEIGHT MANAGEMENT 7.5 MG/0.5ML ~~LOC~~ SOLN: 7.5000 mg | SUBCUTANEOUS | 5 refills | Status: DC

## 2024-04-24 ENCOUNTER — Other Ambulatory Visit: Payer: Self-pay

## 2024-04-24 ENCOUNTER — Other Ambulatory Visit (HOSPITAL_BASED_OUTPATIENT_CLINIC_OR_DEPARTMENT_OTHER): Payer: Self-pay

## 2024-04-24 ENCOUNTER — Other Ambulatory Visit: Payer: Self-pay | Admitting: Family Medicine

## 2024-04-24 MED ORDER — ZEPBOUND 7.5 MG/0.5ML ~~LOC~~ SOAJ
7.5000 mg | SUBCUTANEOUS | 5 refills | Status: DC
  Filled 2024-04-24: qty 2, 28d supply, fill #0
  Filled 2024-05-18: qty 2, 28d supply, fill #1
  Filled 2024-06-28: qty 2, 28d supply, fill #2

## 2024-04-24 MED ORDER — TIRZEPATIDE-WEIGHT MANAGEMENT 7.5 MG/0.5ML ~~LOC~~ SOLN
7.5000 mg | SUBCUTANEOUS | 5 refills | Status: DC
  Filled 2024-04-24: qty 2, 28d supply, fill #0

## 2024-04-24 NOTE — Telephone Encounter (Signed)
 Patient came in believing that rx was written and needed to be picked up by him. I informed him medication order has been sent to [facility]. Patient stated he began getting his meds from [facility] pharmacy and would like medication sent there instead. Pt would also like to be notified when this has been sent.

## 2024-04-24 NOTE — Telephone Encounter (Signed)
 Would you like to try submitting PA?

## 2024-04-24 NOTE — Progress Notes (Unsigned)
 Change to pen

## 2024-04-25 ENCOUNTER — Other Ambulatory Visit (HOSPITAL_BASED_OUTPATIENT_CLINIC_OR_DEPARTMENT_OTHER): Payer: Self-pay

## 2024-05-22 ENCOUNTER — Other Ambulatory Visit (HOSPITAL_BASED_OUTPATIENT_CLINIC_OR_DEPARTMENT_OTHER): Payer: Self-pay

## 2024-05-30 ENCOUNTER — Other Ambulatory Visit (HOSPITAL_BASED_OUTPATIENT_CLINIC_OR_DEPARTMENT_OTHER): Payer: Self-pay

## 2024-06-23 ENCOUNTER — Other Ambulatory Visit: Payer: Self-pay | Admitting: Family Medicine

## 2024-06-28 ENCOUNTER — Other Ambulatory Visit (HOSPITAL_BASED_OUTPATIENT_CLINIC_OR_DEPARTMENT_OTHER): Payer: Self-pay

## 2024-06-28 ENCOUNTER — Other Ambulatory Visit: Payer: Self-pay | Admitting: Family Medicine

## 2024-06-29 ENCOUNTER — Other Ambulatory Visit (HOSPITAL_BASED_OUTPATIENT_CLINIC_OR_DEPARTMENT_OTHER): Payer: Self-pay

## 2024-07-01 ENCOUNTER — Telehealth: Payer: Self-pay | Admitting: Family Medicine

## 2024-07-01 ENCOUNTER — Other Ambulatory Visit (HOSPITAL_BASED_OUTPATIENT_CLINIC_OR_DEPARTMENT_OTHER): Payer: Self-pay

## 2024-07-01 MED ORDER — TIRZEPATIDE-WEIGHT MANAGEMENT 10 MG/0.5ML ~~LOC~~ SOAJ
10.0000 mg | SUBCUTANEOUS | 5 refills | Status: DC
  Filled 2024-07-01: qty 2, 28d supply, fill #0
  Filled 2024-07-30: qty 2, 28d supply, fill #1
  Filled 2024-09-16: qty 2, 28d supply, fill #2

## 2024-07-01 MED ORDER — ZEPBOUND 7.5 MG/0.5ML ~~LOC~~ SOAJ
7.5000 mg | SUBCUTANEOUS | 5 refills | Status: DC
  Filled 2024-07-01: qty 2, 28d supply, fill #0

## 2024-07-01 NOTE — Telephone Encounter (Signed)
 Clinical Call regarding RX  Patient Name First: Travis Last: Burgess Gender: Male DOB: [birth year] Age: 68 Y 7 M 15 D Return Phone Number: [phone] (Primary) Address: City/ State/ Zip: [place]  [address] Client [facility] - Human resources officer [facility] [employer] Provider Katrinka Senior- MD Contact Type Call Who Is Calling Patient / Member / Family / Caregiver Call Type Triage / Clinical Relationship To Patient Self Return Phone Number [phone] (Primary) Chief Complaint Prescription Refill or Medication Request (non symptomatic) Reason for Call Medication Question / Request Initial Comment Caller states that he placed a request for Zepbound  for 10mg , he is wondering can this be filled at [facility] pharmacy. He is not having Sx. Translation No Nurse Assessment Nurse: [place], RN, Alan Date/Time (Eastern Time): 06/29/2024 8:57:07 AM You have opened med assessment, do you wish to continue? ---Yes Please select the assessment type ---Refill Does the patient have enough medication to last until the office opens? ---Unable to obtain loaner dose from Pharmacy Does the client directives allow for assistance with medications after hours? ---No Additional Documentation ---advised to call office on monday Nurse: [place], RN, Alan Date/Time (Eastern Time): 06/29/2024 8:55:55 AM Confirm and document reason for call. If symptomatic, describe symptoms. ---caller states he is on zepbound . was on 7.5 and wants to move up to the 10 and he put in a request. Does the patient have any new or worsening symptoms? ---No Disp. Time Titus Time) Disposition Final User 06/29/2024 8:57:59 AM Clinical Call Yes [place], RN, Alan Final Disposition 06/29/2024 8:57:59 AM Clinical Call Yes [place], RN, Alan

## 2024-07-01 NOTE — Telephone Encounter (Signed)
 Pt would like to move up to Zepbound  10mg .

## 2024-07-06 ENCOUNTER — Other Ambulatory Visit: Payer: Self-pay | Admitting: Cardiology

## 2024-07-06 DIAGNOSIS — E785 Hyperlipidemia, unspecified: Secondary | ICD-10-CM

## 2024-07-20 ENCOUNTER — Other Ambulatory Visit: Payer: Self-pay | Admitting: Cardiology

## 2024-07-20 DIAGNOSIS — E785 Hyperlipidemia, unspecified: Secondary | ICD-10-CM

## 2024-07-30 ENCOUNTER — Telehealth: Payer: Self-pay | Admitting: Family Medicine

## 2024-07-30 ENCOUNTER — Other Ambulatory Visit (HOSPITAL_BASED_OUTPATIENT_CLINIC_OR_DEPARTMENT_OTHER): Payer: Self-pay

## 2024-07-30 DIAGNOSIS — R911 Solitary pulmonary nodule: Secondary | ICD-10-CM

## 2024-07-30 DIAGNOSIS — Z87891 Personal history of nicotine dependence: Secondary | ICD-10-CM

## 2024-07-30 MED FILL — Fluticasone Propionate Nasal Susp 50 MCG/ACT: NASAL | 30 days supply | Qty: 16 | Fill #0 | Status: AC

## 2024-07-30 NOTE — Telephone Encounter (Signed)
 Pulmonary nodule follow-up from September 2024-see routing comments

## 2024-08-20 ENCOUNTER — Telehealth: Payer: Self-pay

## 2024-08-20 NOTE — Telephone Encounter (Signed)
 Copied from CRM [id number]. Topic: Medical Record Request - Other >> Aug 20, 2024  3:28 PM Anairis L wrote: Reason for CRM: Patient is requesting a copy of his last physical. He needs it for insurance purposes. Wife will pick  can pick  in person.

## 2024-09-09 ENCOUNTER — Other Ambulatory Visit

## 2024-09-16 ENCOUNTER — Other Ambulatory Visit (HOSPITAL_BASED_OUTPATIENT_CLINIC_OR_DEPARTMENT_OTHER): Payer: Self-pay

## 2024-09-16 ENCOUNTER — Ambulatory Visit
Admission: RE | Admit: 2024-09-16 | Discharge: 2024-09-16 | Disposition: A | Source: Ambulatory Visit | Attending: Family Medicine | Admitting: Family Medicine

## 2024-09-16 DIAGNOSIS — Z87891 Personal history of nicotine dependence: Secondary | ICD-10-CM

## 2024-09-16 DIAGNOSIS — R911 Solitary pulmonary nodule: Secondary | ICD-10-CM

## 2024-09-18 ENCOUNTER — Ambulatory Visit: Payer: Self-pay | Admitting: Family Medicine

## 2024-09-20 ENCOUNTER — Other Ambulatory Visit (HOSPITAL_BASED_OUTPATIENT_CLINIC_OR_DEPARTMENT_OTHER): Payer: Self-pay

## 2024-09-20 ENCOUNTER — Telehealth: Payer: Self-pay | Admitting: Family Medicine

## 2024-09-20 MED ORDER — ZEPBOUND 12.5 MG/0.5ML ~~LOC~~ SOAJ
12.5000 mg | SUBCUTANEOUS | 5 refills | Status: DC
  Filled 2024-09-21: qty 2, 28d supply, fill #0
  Filled 2024-10-30: qty 2, 28d supply, fill #1
  Filled 2024-12-21: qty 2, 28d supply, fill #2

## 2024-09-20 NOTE — Telephone Encounter (Signed)
 Copied from CRM [id number]. Topic: Clinical - Medication Refill >> Sep 20, 2024  1:04 PM Alfonso HERO wrote: Medication: Zepbound  12.5 MG  Has the patient contacted their pharmacy? Yes (Agent: If no, request that the patient contact the pharmacy for the refill. If patient does not wish to contact the pharmacy document the reason why and proceed with request.) (Agent: If yes, when and what did the pharmacy advise?)  This is the patient's preferred pharmacy:   [facility] [address] [place] [address] Phone: [phone] Fax: [phone]  Is this the correct pharmacy for this prescription? Yes If no, delete pharmacy and type the correct one.   Has the prescription been filled recently? Yes  Is the patient out of the medication? Yes  Has the patient been seen for an appointment in the last year OR does the patient have an upcoming appointment? Yes  Can we respond through MyChart? Yes  Agent: Please be advised that Rx refills may take up to 3 business days. We ask that you follow-up with your pharmacy.

## 2024-09-20 NOTE — Telephone Encounter (Signed)
 Sent!

## 2024-09-21 ENCOUNTER — Other Ambulatory Visit (HOSPITAL_BASED_OUTPATIENT_CLINIC_OR_DEPARTMENT_OTHER): Payer: Self-pay

## 2024-09-21 ENCOUNTER — Other Ambulatory Visit: Payer: Self-pay | Admitting: Family Medicine

## 2024-10-09 ENCOUNTER — Ambulatory Visit: Admitting: Family Medicine

## 2024-10-09 ENCOUNTER — Ambulatory Visit: Payer: Self-pay | Admitting: Family Medicine

## 2024-10-09 ENCOUNTER — Encounter: Payer: Self-pay | Admitting: Family Medicine

## 2024-10-09 VITALS — BP 110/64 | HR 74 | Temp 97.2°F | Ht 71.0 in | Wt 220.8 lb

## 2024-10-09 DIAGNOSIS — I10 Essential (primary) hypertension: Secondary | ICD-10-CM | POA: Diagnosis not present

## 2024-10-09 DIAGNOSIS — E785 Hyperlipidemia, unspecified: Secondary | ICD-10-CM | POA: Diagnosis not present

## 2024-10-09 DIAGNOSIS — Z131 Encounter for screening for diabetes mellitus: Secondary | ICD-10-CM | POA: Diagnosis not present

## 2024-10-09 DIAGNOSIS — I2510 Atherosclerotic heart disease of native coronary artery without angina pectoris: Secondary | ICD-10-CM | POA: Diagnosis not present

## 2024-10-09 DIAGNOSIS — E669 Obesity, unspecified: Secondary | ICD-10-CM

## 2024-10-09 LAB — URINALYSIS, ROUTINE W REFLEX MICROSCOPIC
Bilirubin Urine: NEGATIVE
Hgb urine dipstick: NEGATIVE
Ketones, ur: NEGATIVE
Leukocytes,Ua: NEGATIVE
Nitrite: NEGATIVE
Specific Gravity, Urine: 1.02 (ref 1.000–1.030)
Total Protein, Urine: NEGATIVE
Urine Glucose: NEGATIVE
Urobilinogen, UA: 0.2 (ref 0.0–1.0)
pH: 6 (ref 5.0–8.0)

## 2024-10-09 LAB — COMPREHENSIVE METABOLIC PANEL WITH GFR
ALT: 26 U/L (ref 0–53)
AST: 18 U/L (ref 0–37)
Albumin: 4.6 g/dL (ref 3.5–5.2)
Alkaline Phosphatase: 88 U/L (ref 39–117)
BUN: 20 mg/dL (ref 6–23)
CO2: 29 meq/L (ref 19–32)
Calcium: 9.6 mg/dL (ref 8.4–10.5)
Chloride: 99 meq/L (ref 96–112)
Creatinine, Ser: 1.03 mg/dL (ref 0.40–1.50)
GFR: 74.43 mL/min (ref >60.00)
Glucose, Bld: 76 mg/dL (ref 70–99)
Potassium: 4.5 meq/L (ref 3.5–5.1)
Sodium: 137 meq/L (ref 135–145)
Total Bilirubin: 1.1 mg/dL (ref 0.2–1.2)
Total Protein: 7.2 g/dL (ref 6.0–8.3)

## 2024-10-09 LAB — HEMOGLOBIN A1C: Hgb A1c MFr Bld: 5.6 % (ref 4.6–6.5)

## 2024-10-09 LAB — MICROALBUMIN / CREATININE URINE RATIO
Creatinine,U: 89.2 mg/dL
Microalb Creat Ratio: UNDETERMINED mg/g (ref 0.0–30.0)
Microalb, Ur: <0.7 mg/dL

## 2024-10-09 MED ORDER — LISINOPRIL-HYDROCHLOROTHIAZIDE 10-12.5 MG PO TABS: 1.0000 | ORAL_TABLET | Freq: Every day | ORAL | 1 refills | Status: AC

## 2024-10-09 NOTE — Patient Instructions (Addendum)
 Much improved weight- continue current medicine- if he feels plateaus for a full month would bump to final dose of 15 mg- he's really good about reaching out  blood pressure at goal perhaps overcontrolled- reduce to lisinopril  hydrochlorothiazide  10-12.5 mg and follow up in 6 months or sooner if needed  Happy to trial prednisone if needed but hoping physical therapy will help and could refer to orthopedic if worsens   Please stop by lab before you go If you have mychart- we will send your results within 3 business days of us  receiving them.  If you do not have mychart- we will call you about results within 5 business days of us  receiving them.  *please also note that you will see labs on mychart as soon as they post. I will later go in and write notes on them- will say notes from Dr. Katrinka   Recommended follow up: Return for next already scheduled visit or sooner if needed.

## 2024-10-09 NOTE — Progress Notes (Signed)
 Phone [phone] In person visit   Subjective:   Travis Burgess is a 68 y.o. year old very pleasant male patient who presents for/with See problem oriented charting Chief Complaint  Patient presents with   Medical Management of Chronic Issues    6 month follow up; left sciatic pain;     Past Medical History-  Patient Active Problem List   Diagnosis Date Noted   CAD (coronary artery disease) 03/20/2023    Priority: High   Hyperlipidemia 02/08/2022    Priority: Medium    Essential hypertension 02/08/2022    Priority: Medium    Obesity, morbid (HCC) 01/04/2024    Medications- reviewed and updated Current Outpatient Medications  Medication Sig Dispense Refill   aspirin  EC 81 MG tablet Take 1 tablet (81 mg total) by mouth daily. Swallow whole.     atorvastatin  (LIPITOR) 40 MG tablet TAKE 1 TABLET BY MOUTH EVERY DAY 30 tablet 3   ezetimibe  (ZETIA ) 10 MG tablet TAKE 1 TABLET BY MOUTH EVERY DAY 30 tablet 8   fluticasone  (FLONASE ) 50 MCG/ACT nasal spray Place 2 sprays into both nostrils daily. 16 g 6   lisinopril -hydrochlorothiazide  (ZESTORETIC ) 10-12.5 MG tablet Take 1 tablet by mouth daily. 90 tablet 1   Saline (SIMPLY SALINE) 0.9 % AERS Place 2 each into the nose as directed. Use nightly for sinus hygiene long-term.  Can also be used as many times daily as desired to assist with clearing congested sinuses. 127 mL 11   tirzepatide  (ZEPBOUND ) 12.5 MG/0.5ML Pen Inject 12.5 mg into the skin once a week. 2 mL 5   triamcinolone  cream (KENALOG ) 0.1 % Apply 1 Application topically 2 (two) times daily. For 7-10 days maximum 80 g 0   No current facility-administered medications for this visit.     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   #Obesity- significant ongoing weight loss now on Zepbound  12.5 mg- nearing overweight BMI! Tolerating the medicine well. Hed like to get to 190 if possible. Discussed focusing on protein intake - much improved- continue current medicine- if he feels plateaus for a full month would bump to final dose of 15 mg- he's really good about reaching out Wt Readings from Last 3 Encounters:  10/09/24 220 lb 12.8 oz (100.2 kg)  04/08/24 243 lb 9.6 oz (110.5 kg)  01/04/24 261 lb 3.2 oz (118.5 kg)    #hypertension S: medication: Lisinopril  hydrochlorothiazide  20-12.5 mg- did have some lightheadedness a month ago and felt better with half dose BP Readings from Last 3 Encounters:  10/09/24 110/64  04/08/24 122/64  01/04/24 (!) 140/72  A/P: blood pressure at goal perhaps overcontrolled- reduce to lisinopril  hydrochlorothiazide  10-12.5 mg and follow up in 6 months or sooner if needed  #Left sided leg pain- radiating down the left leg- flared with travel to [place]  last year and did well on prednisone in the past. Started bothering him a month ago- worse at night around 7-8 pm in recliner- eases  off some in bed. Saw walk in clinic 3 weeks ago and was doing better. Bothering him some- doing some advil sparingly- may want another course. Has home exercises but hasn't started.    #CAD #hyperlipidemia- CT calcium  08/22/18 with score of 587 at 90%-saw cardiology Dr. Kate in 2024 with low risk NM pet CT and echocardiogram.  S: Medication: Atorvastatin  40 mg, aspirin  81 mg, zetia  10 mg -no chest pain or shortness of breath  Lab Results  Component Value Date   CHOL 105 04/08/2024   HDL 39.60 04/08/2024   LDLCALC 47 04/08/2024   TRIG 93.0 04/08/2024   CHOLHDL 3 04/08/2024   A/P: coronary artery disease asymptomatic  continue current medications  Lipids at goal- continue current medications   #mild sleep apnea- using CPAP in general. Has pulled off with weight loss- thinks snoring better and feels resting better  # Pulmonary nodules-noted on PET/CT 08/08/2023-with 1 year follow-up scan recommended if high risk.   -Repeat 09/18/2024 with stability and no further follow-up -Also fatty liver noted in 2024 but NOT noted in 2025 with weight loss!    #prediabetes- better last visit- update today- was 5.6 down from 6 last visit  Recommended follow up: Return for next already scheduled visit or sooner if needed. Future Appointments  Date Time Provider Department Center  04/09/2025  8:20 AM Travis Garnette KIDD, MD [name]    Lab/Order associations:   ICD-10-CM   1. Essential hypertension  I10 Comprehensive metabolic panel with GFR    Urinalysis, Routine w reflex microscopic    Microalbumin / creatinine urine ratio    2. Hyperlipidemia, unspecified hyperlipidemia type  E78.5     3. Coronary artery disease involving native coronary artery of native heart without angina pectoris  I25.10     4. Screening for diabetes mellitus  Z13.1 Hemoglobin A1c    5. Obesity (BMI 30-39.9)  E66.9 Hemoglobin A1c      Meds ordered this encounter  Medications   lisinopril -hydrochlorothiazide  (ZESTORETIC ) 10-12.5 MG tablet    Sig: Take 1 tablet by mouth daily.    Dispense:  90 tablet    Refill:  1    Return precautions advised.  Garnette Katrinka, MD

## 2024-10-30 ENCOUNTER — Other Ambulatory Visit (HOSPITAL_BASED_OUTPATIENT_CLINIC_OR_DEPARTMENT_OTHER): Payer: Self-pay

## 2024-11-13 ENCOUNTER — Other Ambulatory Visit: Payer: Self-pay | Admitting: Cardiology

## 2024-11-13 DIAGNOSIS — E785 Hyperlipidemia, unspecified: Secondary | ICD-10-CM

## 2024-12-03 ENCOUNTER — Other Ambulatory Visit (HOSPITAL_COMMUNITY): Payer: Self-pay

## 2024-12-21 ENCOUNTER — Other Ambulatory Visit (HOSPITAL_BASED_OUTPATIENT_CLINIC_OR_DEPARTMENT_OTHER): Payer: Self-pay

## 2024-12-25 ENCOUNTER — Other Ambulatory Visit (HOSPITAL_BASED_OUTPATIENT_CLINIC_OR_DEPARTMENT_OTHER): Payer: Self-pay

## 2024-12-27 ENCOUNTER — Encounter: Payer: Self-pay | Admitting: Family Medicine

## 2024-12-27 MED ORDER — WEGOVY 1.5 MG PO TABS
1.5000 mg | ORAL_TABLET | Freq: Every day | ORAL | 5 refills | Status: DC
  Filled 2024-12-27: qty 30, 30d supply, fill #0

## 2024-12-28 ENCOUNTER — Other Ambulatory Visit (HOSPITAL_BASED_OUTPATIENT_CLINIC_OR_DEPARTMENT_OTHER): Payer: Self-pay

## 2024-12-31 ENCOUNTER — Other Ambulatory Visit: Payer: Self-pay

## 2024-12-31 ENCOUNTER — Telehealth (HOSPITAL_BASED_OUTPATIENT_CLINIC_OR_DEPARTMENT_OTHER): Payer: Self-pay

## 2024-12-31 NOTE — Telephone Encounter (Signed)
 Yes thanks-you can print and stamp

## 2024-12-31 NOTE — Telephone Encounter (Signed)
 Patient requesting paper copy of script so he can take it to [facility]. Is this okay?

## 2024-12-31 NOTE — Telephone Encounter (Signed)
 Pharmacy Patient Advocate Encounter   Received notification from Pt Calls Messages that prior authorization for Wegovy  1.5 mg tablets is required/requested.   Insurance verification completed.   The patient is insured through [facility].   Per test claim: Per test claim, medication is not covered due to plan/benefit exclusion, PA not submitted at this time

## 2025-01-01 ENCOUNTER — Other Ambulatory Visit: Payer: Self-pay | Admitting: *Deleted

## 2025-01-01 ENCOUNTER — Other Ambulatory Visit: Payer: Self-pay

## 2025-01-01 ENCOUNTER — Other Ambulatory Visit: Payer: Self-pay | Admitting: Family Medicine

## 2025-01-01 MED ORDER — WEGOVY 1.5 MG PO TABS: 1.5000 mg | ORAL_TABLET | Freq: Every day | ORAL | 5 refills | Status: DC

## 2025-01-01 MED ORDER — WEGOVY 1.5 MG PO TABS: 1.5000 mg | ORAL_TABLET | Freq: Every day | ORAL | 5 refills | Status: AC

## 2025-04-09 ENCOUNTER — Encounter: Admitting: Family Medicine
